# Patient Record
Sex: Male | Born: 1950 | Race: White | Hispanic: No | Marital: Married | Smoking: Never smoker
Health system: Southern US, Community
[De-identification: ages and names within clinical notes are randomized; demographics above are authoritative.]

## PROBLEM LIST (undated history)

## (undated) DIAGNOSIS — H269 Unspecified cataract: Secondary | ICD-10-CM

## (undated) DIAGNOSIS — I10 Essential (primary) hypertension: Secondary | ICD-10-CM

## (undated) DIAGNOSIS — E785 Hyperlipidemia, unspecified: Secondary | ICD-10-CM

## (undated) DIAGNOSIS — H539 Unspecified visual disturbance: Secondary | ICD-10-CM

## (undated) DIAGNOSIS — R55 Syncope and collapse: Secondary | ICD-10-CM

## (undated) DIAGNOSIS — M109 Gout, unspecified: Secondary | ICD-10-CM

## (undated) DIAGNOSIS — E039 Hypothyroidism, unspecified: Secondary | ICD-10-CM

## (undated) DIAGNOSIS — H43399 Other vitreous opacities, unspecified eye: Secondary | ICD-10-CM

## (undated) DIAGNOSIS — M25519 Pain in unspecified shoulder: Secondary | ICD-10-CM

## (undated) DIAGNOSIS — M1990 Unspecified osteoarthritis, unspecified site: Secondary | ICD-10-CM

## (undated) HISTORY — PX: VASECTOMY: SHX75

## (undated) HISTORY — DX: Syncope and collapse: R55

## (undated) HISTORY — PX: KNEE ARTHROSCOPY: SUR90

## (undated) HISTORY — DX: Hypothyroidism, unspecified: E03.9

## (undated) HISTORY — DX: Pain in unspecified shoulder: M25.519

## (undated) HISTORY — DX: Unspecified cataract: H26.9

## (undated) HISTORY — DX: Hyperlipidemia, unspecified: E78.5

## (undated) HISTORY — PX: EYE SURGERY: SHX253

## (undated) HISTORY — DX: Other vitreous opacities, unspecified eye: H43.399

## (undated) HISTORY — PX: CARPAL TUNNEL RELEASE: SHX101

## (undated) HISTORY — DX: Gout, unspecified: M10.9

## (undated) HISTORY — DX: Unspecified visual disturbance: H53.9

## (undated) HISTORY — DX: Unspecified osteoarthritis, unspecified site: M19.90

## (undated) HISTORY — DX: Essential (primary) hypertension: I10

---

## 2004-04-22 ENCOUNTER — Emergency Department (HOSPITAL_COMMUNITY): Admission: EM | Admit: 2004-04-22 | Discharge: 2004-04-22 | Payer: Self-pay | Admitting: Emergency Medicine

## 2006-04-08 ENCOUNTER — Ambulatory Visit: Payer: Self-pay | Admitting: Gastroenterology

## 2006-04-15 ENCOUNTER — Ambulatory Visit: Payer: Self-pay | Admitting: Gastroenterology

## 2007-05-04 LAB — HM COLONOSCOPY: HM Colonoscopy: NORMAL

## 2007-07-20 ENCOUNTER — Encounter: Payer: Self-pay | Admitting: Family Medicine

## 2008-08-12 ENCOUNTER — Emergency Department (HOSPITAL_COMMUNITY): Admission: EM | Admit: 2008-08-12 | Discharge: 2008-08-12 | Payer: Self-pay | Admitting: Emergency Medicine

## 2008-08-26 ENCOUNTER — Ambulatory Visit: Payer: Self-pay | Admitting: Family Medicine

## 2008-08-26 DIAGNOSIS — E785 Hyperlipidemia, unspecified: Secondary | ICD-10-CM | POA: Insufficient documentation

## 2008-08-26 DIAGNOSIS — E039 Hypothyroidism, unspecified: Secondary | ICD-10-CM | POA: Insufficient documentation

## 2008-08-26 DIAGNOSIS — I10 Essential (primary) hypertension: Secondary | ICD-10-CM | POA: Insufficient documentation

## 2008-08-26 HISTORY — DX: Hyperlipidemia, unspecified: E78.5

## 2008-08-26 HISTORY — DX: Essential (primary) hypertension: I10

## 2008-08-26 HISTORY — DX: Hypothyroidism, unspecified: E03.9

## 2008-11-26 ENCOUNTER — Telehealth: Payer: Self-pay | Admitting: Family Medicine

## 2008-11-29 ENCOUNTER — Ambulatory Visit: Payer: Self-pay | Admitting: Family Medicine

## 2008-12-02 LAB — CONVERTED CEMR LAB
ALT: 26 units/L (ref 0–53)
AST: 20 units/L (ref 0–37)
Albumin: 3.9 g/dL (ref 3.5–5.2)
Alkaline Phosphatase: 54 units/L (ref 39–117)
Bilirubin, Direct: 0.2 mg/dL (ref 0.0–0.3)
Cholesterol: 163 mg/dL (ref 0–200)
Direct LDL: 92.6 mg/dL
HDL: 39.5 mg/dL (ref >39.00)
Total Bilirubin: 1.3 mg/dL — ABNORMAL HIGH (ref 0.3–1.2)
Total CHOL/HDL Ratio: 4
Total Protein: 6.9 g/dL (ref 6.0–8.3)
Triglycerides: 233 mg/dL — ABNORMAL HIGH (ref 0.0–149.0)
VLDL: 46.6 mg/dL — ABNORMAL HIGH (ref 0.0–40.0)

## 2009-02-20 ENCOUNTER — Ambulatory Visit: Payer: Self-pay | Admitting: Family Medicine

## 2009-02-20 DIAGNOSIS — H43399 Other vitreous opacities, unspecified eye: Secondary | ICD-10-CM

## 2009-02-20 DIAGNOSIS — M109 Gout, unspecified: Secondary | ICD-10-CM | POA: Insufficient documentation

## 2009-02-20 HISTORY — DX: Gout, unspecified: M10.9

## 2009-02-20 HISTORY — DX: Other vitreous opacities, unspecified eye: H43.399

## 2009-06-12 ENCOUNTER — Telehealth: Payer: Self-pay | Admitting: Family Medicine

## 2009-06-13 ENCOUNTER — Ambulatory Visit: Payer: Self-pay | Admitting: Family Medicine

## 2009-06-19 ENCOUNTER — Telehealth: Payer: Self-pay | Admitting: Family Medicine

## 2009-07-11 ENCOUNTER — Telehealth: Payer: Self-pay | Admitting: Family Medicine

## 2009-09-11 ENCOUNTER — Ambulatory Visit: Payer: Self-pay | Admitting: Family Medicine

## 2009-09-11 DIAGNOSIS — M25519 Pain in unspecified shoulder: Secondary | ICD-10-CM

## 2009-09-11 HISTORY — DX: Pain in unspecified shoulder: M25.519

## 2009-09-12 LAB — CONVERTED CEMR LAB
TSH: 1.97 microintl units/mL (ref 0.35–5.50)
Uric Acid, Serum: 5.9 mg/dL (ref 4.0–7.8)

## 2009-10-10 ENCOUNTER — Telehealth: Payer: Self-pay | Admitting: Family Medicine

## 2009-10-14 ENCOUNTER — Ambulatory Visit: Payer: Self-pay | Admitting: Family Medicine

## 2009-10-14 DIAGNOSIS — H539 Unspecified visual disturbance: Secondary | ICD-10-CM

## 2009-10-14 HISTORY — DX: Unspecified visual disturbance: H53.9

## 2009-10-21 ENCOUNTER — Ambulatory Visit (HOSPITAL_COMMUNITY): Admission: RE | Admit: 2009-10-21 | Discharge: 2009-10-22 | Payer: Self-pay | Admitting: Ophthalmology

## 2010-03-21 ENCOUNTER — Emergency Department (HOSPITAL_COMMUNITY): Admission: EM | Admit: 2010-03-21 | Discharge: 2010-03-22 | Payer: Self-pay | Admitting: Emergency Medicine

## 2010-03-25 ENCOUNTER — Telehealth: Payer: Self-pay | Admitting: Family Medicine

## 2010-04-14 ENCOUNTER — Ambulatory Visit: Payer: Self-pay | Admitting: Family Medicine

## 2010-04-14 DIAGNOSIS — R55 Syncope and collapse: Secondary | ICD-10-CM | POA: Insufficient documentation

## 2010-04-14 HISTORY — DX: Syncope and collapse: R55

## 2010-04-16 LAB — CONVERTED CEMR LAB
ALT: 64 units/L — ABNORMAL HIGH (ref 0–53)
AST: 33 units/L (ref 0–37)
Albumin: 4.2 g/dL (ref 3.5–5.2)
Alkaline Phosphatase: 74 units/L (ref 39–117)
Bilirubin, Direct: 0.1 mg/dL (ref 0.0–0.3)
Cholesterol: 187 mg/dL (ref 0–200)
Direct LDL: 99.7 mg/dL
HDL: 35.6 mg/dL — ABNORMAL LOW (ref >39.00)
TSH: 2.99 microintl units/mL (ref 0.35–5.50)
Total Bilirubin: 0.5 mg/dL (ref 0.3–1.2)
Total CHOL/HDL Ratio: 5
Total Protein: 6.8 g/dL (ref 6.0–8.3)
Triglycerides: 456 mg/dL — ABNORMAL HIGH (ref 0.0–149.0)
Uric Acid, Serum: 5.6 mg/dL (ref 4.0–7.8)
VLDL: 91.2 mg/dL — ABNORMAL HIGH (ref 0.0–40.0)

## 2010-05-26 ENCOUNTER — Telehealth: Payer: Self-pay | Admitting: Family Medicine

## 2010-06-02 NOTE — Assessment & Plan Note (Signed)
Summary: EYE EVALUATION / VISUAL DISTURBANCE // RS   Vital Signs:  Patient profile:   60 year old male Temp:     98 degrees F oral BP sitting:   150 / 88  (left arm) Cuff size:   large  Vitals Entered By: Sid Falcon LPN (October 14, 2009 9:52 AM) CC: Visual disturbances  Vision Screening:Left eye with correction: 20 / 25 Right eye with correction: 20 / 30 Both eyes with correction: 20 / 30        Vision Entered By: Sid Falcon LPN (October 14, 2009 9:55 AM)   History of Present Illness: Onset Last Friday slightly cloudy vision R eye.  No eye pain. Symptoms are mild and not functionally limiting.  No injury and no chemical exposures. Denies eye drainage or redness.  No exacerbating or alleviating features. No contact use.  No hx of similar sxs.  Allergies: No Known Drug Allergies  Past History:  Past Medical History: Last updated: 06/13/2009 Arthritis knee Hyperlipidemia Hypertension Hypothyroidism Gout  Review of Systems      See HPI  Physical Exam  General:  Well-developed,well-nourished,in no acute distress; alert,appropriate and cooperative throughout examination Head:  Normocephalic and atraumatic without obvious abnormalities. No apparent alopecia or balding. Eyes:  Acuity-see recorded. pupils equal, pupils round, pupils reactive to light, pupils react to accomodation, corneas and lenses clear, no injection, no iris abnormalities, and no nystagmus.  R superior retinal appears abnormal without normal red reflex.  L retina normal. Ears:  External ear exam shows no significant lesions or deformities.  Otoscopic examination reveals clear canals, tympanic membranes are intact bilaterally without bulging, retraction, inflammation or discharge. Hearing is grossly normal bilaterally. Neck:  No deformities, masses, or tenderness noted. Lungs:  Normal respiratory effort, chest expands symmetrically. Lungs are clear to auscultation, no crackles or wheezes. Heart:   Normal rate and regular rhythm. S1 and S2 normal without gallop, murmur, click, rub or other extra sounds.   Impression & Recommendations:  Problem # 1:  UNSPECIFIED VISUAL DISTURBANCE (ICD-368.9) Assessment New pt will be seen by ophthalmology today to further assess.  Sent to [facility]. Orders: Ophthalmology Referral (Ophthalmology)  Complete Medication List: 1)  Lipitor 40 Mg Tabs (Atorvastatin calcium) .... Once daily 2)  Amlodipine Besylate 5 Mg Tabs (Amlodipine besylate) .... Once daily 3)  Levothroid 112 Mcg Tabs (Levothyroxine sodium) .... Once daily 4)  Prednisone 20 Mg Tabs (Prednisone) .... Taper as follows: 3-3-2-2-2-1-1 5)  Allopurinol 300 Mg Tabs (Allopurinol) .... Once daily

## 2010-06-02 NOTE — Assessment & Plan Note (Signed)
Summary: fu on meds/njr   Vital Signs:  Patient profile:   60 year old male BP sitting:   150 / 90  (left arm) Cuff size:   large  Vitals Entered By: Sid Falcon LPN (June 13, 2009 5:00 PM) CC: Follow-up on meds, gout flare-up recently   History of Present Illness: Patient here to discuss gout issues.  just saw orthopedist last week and had right knee effusion secondary to gout. Does not have many flareups per year but has fairly severe flareups. Acute treatments include prednisone and colchicine which seemed to work effectively.  drinks some alcohol but is conscious to avoid excessive use and also hydrates well. Has not noted correlation with any specific foods.   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 Lungs are clear to auscultation, no crackles or wheezes. Heart:  Normal rate and regular rhythm. S1 and S2 normal without gallop, murmur, click, rub or other extra sounds. Extremities:  No clubbing, cyanosis, edema, or deformity noted with normal full range of motion of all joints.     Impression & Recommendations:  Problem # 1:  GOUT, UNSPECIFIED (ICD-274.9) long discussion with patient regarding options. Would like to consider preventative therapy. Give current episode another couple weeks to settle down. Start allopurinol and titrate slowly as instructed.  Use low dose colchicine  during titration phase. Reassess 2 months and obtain uric acid level then His updated medication list for this problem includes:    Colchicine 0.6 Mg Tabs (Colchicine) ..... One by mouth two times a day as needed gout    Allopurinol 100 Mg Tabs (Allopurinol) ..... One by mouth once daily for 2 weeks then two by mouth once daily for 2 weeks then 3 by mouth once daily  Complete Medication List: 1)  Lipitor 40 Mg Tabs (Atorvastatin calcium) .... Once daily 2)  Amlodipine Besylate 5 Mg Tabs (Amlodipine besylate) .... Once daily 3)  Levothroid 112 Mcg Tabs (Levothyroxine sodium) .... Once daily 4)  Colchicine 0.6 Mg Tabs (Colchicine) .... One by mouth two times a day as needed gout 5)  Prednisone 20 Mg Tabs (Prednisone) .... Taper as follows: 3-3-2-2-2-1-1 6)  Allopurinol 100 Mg Tabs (Allopurinol) .... One by mouth once daily for 2 weeks then two by mouth once daily for 2 weeks then 3 by mouth once daily  Patient Instructions: 1)  Wait 1-2 weeks before starting allopurinol 2)  Titrate allopurinol as directed 3)  Take colchicine one to 2 tablets daily during initial titration 4)  Add low-dose prednisone as needed if any signs of acute flareup 5)  Please schedule a follow-up appointment in 2 months.  Prescriptions: ALLOPURINOL 100 MG TABS (ALLOPURINOL) one by mouth once daily for 2 weeks then two by mouth once daily for 2 weeks then 3 by mouth once daily  #90 x 5   Entered and Authorized by:   Evelena Peat MD   Signed by:   Evelena Peat MD on 06/13/2009   Method used:   [facility]  [place] (retail)       [address]       [place], [place]  [address]       Ph: [phone] or [phone]       Fax: [phone]   RxID:   [id number]

## 2010-06-02 NOTE — Assessment & Plan Note (Signed)
Summary: shoulder pain/njr   Vital Signs:  Patient profile:   60 year old male Temp:     98.6 degrees F oral BP sitting:   160 / 90  (left arm) Cuff size:   large  Vitals Entered By: Sid Falcon LPN (Sep 11, 2009 1:51 PM)  Serial Vital Signs/Assessments:  Time      Position  BP       Pulse  Resp  Temp     By                     142/90                         Evelena Peat MD  CC: Right shoulder discomfort, Hypertension Management   History of Present Illness: Patient here for the following items.  Acute problem of right shoulder pain. Onset 2 weeks ago. No specific injury. Plays a lot of golf. Pain is achy in quality moderate severity worse with abduction and internal rotation. No neck pain. No obvious weakness. Heat and topical cream without relief. Ice helped. Advil helps slightly.  History gout. Recently started allopurinol. 3 episodes of acute gout since starting allopurino. At 300 mg dose for 6 weeks now. He has some ongoing inflammatory changes right hand. Prednisone helps.  History hypertension. Compliant with amlodipine. No dizziness. Does not monitor blood pressure.  Hypertension History:      He denies headache, chest pain, palpitations, dyspnea with exertion, orthopnea, peripheral edema, syncope, and side effects from treatment.        Positive major cardiovascular risk factors include male age 48 years old or older, hyperlipidemia, and hypertension.  Negative major cardiovascular risk factors include non-tobacco-user status.     Allergies (verified): No Known Drug Allergies  Past History:  Past Medical History: Last updated: 06/13/2009 Arthritis knee Hyperlipidemia Hypertension Hypothyroidism Gout  Review of Systems      See HPI  Physical Exam  General:  Well-developed,well-nourished,in no acute distress; alert,appropriate and cooperative throughout examination Mouth:  Oral mucosa and oropharynx without lesions or exudates.  Teeth in good  repair. Neck:  No deformities, masses, or tenderness noted. Lungs:  Normal respiratory effort, chest expands symmetrically. Lungs are clear to auscultation, no crackles or wheezes. Heart:  Normal rate and regular rhythm. S1 and S2 normal without gallop, murmur, click, rub or other extra sounds. Extremities:  right hand reveals minimal increased warmth. Mild swelling involving the digits of the right hand compared to the left. No wrist pain. No rashes.   Shoulder/Elbow Exam  General:    Well-developed, well-nourished, normal body habitus; no deformities, normal grooming.    Inspection:    Inspection is normal.    Palpation:    Non-tender to palpation bilaterally.    Shoulder Exam:    Right:    Inspection:  Normal    Palpation:  Normal    Stability:  stable    Tenderness:  no    Swelling:  no    Erythema:  no    pain with abduction against resistance and internal rotation.   Impression & Recommendations:  Problem # 1:  GOUT, UNSPECIFIED (ICD-274.9) repeat uric acid.  Pred taper prescribed. The following medications were removed from the medication list:    Colchicine 0.6 Mg Tabs (Colchicine) ..... One by mouth two times a day as needed gout His updated medication list for this problem includes:    Allopurinol 100 Mg Tabs (Allopurinol) .Marland KitchenMarland KitchenMarland KitchenMarland Kitchen  One by mouth once daily for 2 weeks then two by mouth once daily for 2 weeks then 3 by mouth once daily  Orders: Venipuncture ([address]) TLB-Uric Acid, Blood (84550-URIC)  Problem # 2:  HYPERTENSION (ICD-401.9) marginal control here.  Work on weight loss and monitor. His updated medication list for this problem includes:    Amlodipine Besylate 5 Mg Tabs (Amlodipine besylate) ..... Once daily  Problem # 3:  SHOULDER PAIN (ICD-719.41) Assessment: New Suspect rotator cuff tendonitis.  Improving.  pred taper and cont icing and gentle ROM stretches.  Problem # 4:  HYPOTHYROIDISM (ICD-244.9)  His updated medication list for this problem  includes:    Levothroid 112 Mcg Tabs (Levothyroxine sodium) ..... Once daily  Orders: Venipuncture ([address]) TLB-TSH (Thyroid Stimulating Hormone) (84443-TSH)  Complete Medication List: 1)  Lipitor 40 Mg Tabs (Atorvastatin calcium) .... Once daily 2)  Amlodipine Besylate 5 Mg Tabs (Amlodipine besylate) .... Once daily 3)  Levothroid 112 Mcg Tabs (Levothyroxine sodium) .... Once daily 4)  Prednisone 20 Mg Tabs (Prednisone) .... Taper as follows: 3-3-2-2-2-1-1 5)  Allopurinol 100 Mg Tabs (Allopurinol) .... One by mouth once daily for 2 weeks then two by mouth once daily for 2 weeks then 3 by mouth once daily  Hypertension Assessment/Plan:      The patient's hypertensive risk group is category B: At least one risk factor (excluding diabetes) with no target organ damage.  Today's blood pressure is 160/90.    Patient Instructions: 1)  It is important that you exercise reguarly at least 20 minutes 5 times a week. If you develop chest pain, have severe difficulty breathing, or feel very tired, stop exercising immediately and seek medical attention.  2)  You need to lose weight. Consider a lower calorie diet and regular exercise.  3)  Check your  Blood Pressure regularly . If it is above:  140/90 you should make an appointment. Prescriptions: PREDNISONE 20 MG TABS (PREDNISONE) taper as follows: 3-3-2-2-2-1-1  #14 x 1   Entered and Authorized by:   Evelena Peat MD   Signed by:   Evelena Peat MD on 09/11/2009   Method used:   [facility]        [facility]  [place] (retail)       [address]       [place], [place]  [address]       Ph: [phone] or [phone]       Fax: [phone]   RxID:   [id number]

## 2010-06-02 NOTE — Progress Notes (Signed)
Summary: need gout meds  Phone Note Call from Patient Call back at Work Phone [phone]   Caller: Patient----LIVE CALL Summary of Call: Barry Vazquez to Ortho Specialist, Dr Darrelyn Hillock, on Monday for gout. They told him that he will need somrthing for  this . Ortho dr would not prescribe the meds. [facility] [place]. Any ? call the patient. Initial call taken by: Warnell Forester,  June 12, 2009 10:16 AM  Follow-up for Phone Call        There are acute and chronic (suppressive) treatments for gout.  If he is referring to acute flare, he has taken prednisone in the past and we could refill once.  If ortho suggested daily suppressive therapy he would need f/u to discuss as there are several important issues to explain in how we would titrate, etc. Follow-up by: Evelena Peat MD,  June 12, 2009 10:49 AM  Additional Follow-up for Phone Call Additional follow up Details #1::        Pt states he needs suppressive therapy, and will cal back with his schedule to make appt with Dr. Caryl Never. Additional Follow-up by: Lynann Beaver CMA,  June 12, 2009 12:50 PM

## 2010-06-02 NOTE — Progress Notes (Signed)
Summary: Pt req new script for Allopurinol 300mg  qd  Phone Note Call from Patient Call back at Home Phone [phone]   Caller: Patient Summary of Call: Pt called and said that he needs new script for Allopurinol 300mg  once daily. [facility] in to [facility] [place] Initial call taken by: Lucy Antigua,  October 10, 2009 3:41 PM    New/Updated Medications: ALLOPURINOL 300 MG TABS (ALLOPURINOL) once daily Prescriptions: ALLOPURINOL 300 MG TABS (ALLOPURINOL) once daily  #90 x 3   Entered by:   Sid Falcon LPN   Authorized by:   Evelena Peat MD   Signed by:   Sid Falcon LPN on 09/81/1914   Method used:   Electronically to        [facility]  [place] (retail)       [address]       [place], [place]  [address]       Ph: [phone] or [phone]       Fax: [phone]   RxID:   [id number]

## 2010-06-02 NOTE — Progress Notes (Signed)
Summary: "Blacked out at a party" FYI  Phone Note Call from Patient   Caller: Patient Call For: Evelena Peat MD Summary of Call: Med refill request Also, pt requested Dr Caryl Never be informed he "blacked out at a party Sat night".  To [facility] ER, tons of testing, labs, CT scans all neg.  I feel great".  Syncope episode?  Offered F/U OV.  Pt wants to drop off his paperwork and "if Dr Caryl Never wants to see me, let him know". Initial call taken by: Sid Falcon LPN,  March 25, 2010 10:43 AM  Follow-up for Phone Call        I would rec follow up to reassess. Follow-up by: Evelena Peat MD,  March 25, 2010 12:42 PM  Additional Follow-up for Phone Call Additional follow up Details #1::        Message left on home phone to call for OV Additional Follow-up by: Sid Falcon LPN,  March 25, 2010 1:19 PM    Prescriptions: AMLODIPINE BESYLATE 5 MG TABS (AMLODIPINE BESYLATE) once daily  #90 x 3   Entered by:   Sid Falcon LPN   Authorized by:   Evelena Peat MD   Signed by:   Sid Falcon LPN on 91/47/8295   Method used:   Electronically to        [facility]* (retail)             ,          Ph: [phone]       Fax: [phone]   RxID:   [id number]

## 2010-06-02 NOTE — Progress Notes (Signed)
Summary: Rx request for gout  Phone Note Call from Patient   Caller: Patient Call For: Evelena Peat MD Summary of Call: called from airport, will return Sunday. Is having gout trouble- requesting prednisone  to pick up [facility] Initial call taken by: Raechel Ache, RN,  June 19, 2009 3:48 PM  Follow-up for Phone Call        OK to call in prednisone 10mg  taper: 4-4-4-3-3-2-1, disp#21 Follow-up by: Evelena Peat MD,  June 19, 2009 4:30 PM  Additional Follow-up for Phone Call Additional follow up Details #1::        Rx Called In Additional Follow-up by: Raechel Ache, RN,  June 19, 2009 4:35 PM

## 2010-06-02 NOTE — Progress Notes (Signed)
Summary: refill  Phone Note Call from Patient   Caller: Patient Call For: Evelena Peat MD Summary of Call: took last of prednisone and still has some gout flare in finger- asking for another refill. [facility]  his cell [phone] Initial call taken by: Raechel Ache, RN,  July 11, 2009 11:46 AM  Follow-up for Phone Call        may refill once. Follow-up by: Evelena Peat MD,  July 11, 2009 11:56 AM    Prescriptions: PREDNISONE 20 MG TABS (PREDNISONE) taper as follows: 3-3-2-2-2-1-1  #14 x 1   Entered by:   Raechel Ache, RN   Authorized by:   Evelena Peat MD   Signed by:   Raechel Ache, RN on 07/11/2009   Method used:   [facility]        [facility]  [place] (retail)       [address]       [place], [place]  [address]       Ph: [phone] or [phone]       Fax: [phone]   RxID:   [id number]

## 2010-06-04 NOTE — Progress Notes (Signed)
Summary: REFILL REQUEST prednisone  Phone Note Refill Request Message from:  Patient on May 26, 2010 12:15 PM  Refills Requested: Medication #1:  PREDNISONE 20 MG TABS taper as follows: 3-3-2-2-2-1-1   Notes: Gout flare up in R knee.... [facility] [place] Pt can be reached at  [phone] with any questions / concerns.    Initial call taken by: Debbra Riding,  May 26, 2010 12:16 PM  Follow-up for Phone Call        OK to refill once Follow-up by: Evelena Peat MD,  May 26, 2010 2:45 PM    Prescriptions: PREDNISONE 20 MG TABS (PREDNISONE) taper as follows: 3-3-2-2-2-1-1  #14 Tablet x 0   Entered by:   Sid Falcon LPN   Authorized by:   Evelena Peat MD   Signed by:   Sid Falcon LPN on 60/73/7106   Method used:   Electronically to        [facility]  [place] (retail)       [address]       [place], [place]  [address]       Ph: [phone] or [phone]       Fax: [phone]   RxID:   [id number]

## 2010-06-04 NOTE — Assessment & Plan Note (Signed)
Summary: FUP ON MEDS//CCM/pt rsc/cjr   Vital Signs:  Patient profile:   60 year old male Weight:      265 pounds Temp:     98.3 degrees F oral BP sitting:   142 / 82  (left arm) Cuff size:   large  Vitals Entered By: Sid Falcon LPN (April 14, 2010 11:08 AM)  History of Present Illness: Patient seen for followup multiple medical problems. History of gout which has been treated with allopurinol no recent flareups. History hypothyroidism and takes Levothroid. Compliant with therapy. No fatigue issues. Hypertension treated with amlodipine. Hyperlipidemia treated with Lipitor.  Detached retina his last visit here and had surgery November. 2 days after his surgery he had syncopal episode at a party. He had been given some sort of diuretic following his surgery. He recalls feeling nausea and sweaty followed by very brief syncope.  Blood pressure was low very transiently. Patient was seen in emergency room and had several labs which were unremarkable. EKG unremarkable. Cardiac enzymes negative. CT of the head unremarkable. No dizziness since then. Possible vagal reaction.  ER records reviewed.  ?vagal episode.  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      Physical Exam  General:  Well-developed,well-nourished,in no acute distress; alert,appropriate and cooperative throughout examination Ears:  External ear exam shows no significant lesions or deformities.  Otoscopic examination reveals clear canals, tympanic membranes are intact bilaterally without bulging, retraction, inflammation or discharge. Hearing is grossly normal bilaterally. Mouth:  Oral mucosa and oropharynx without lesions or exudates.  Teeth in good repair. Neck:  No deformities, masses, or tenderness noted. Lungs:  Normal respiratory effort, chest expands symmetrically. Lungs are clear to auscultation, no crackles or wheezes. Heart:  Normal rate and regular rhythm. S1 and S2 normal without gallop, murmur, click, rub or other extra sounds. Extremities:  No clubbing, cyanosis, edema, or deformity noted with normal full range of motion of all joints.   Neurologic:  alert & oriented X3, cranial nerves II-XII intact, strength normal in all extremities, sensation intact to light touch, and gait normal.     Impression & Recommendations:  Problem # 1:  HYPERTENSION (ICD-401.9)  His updated medication list for this problem includes:    Amlodipine Besylate 5 Mg Tabs (Amlodipine besylate) ..... Once daily  Problem # 2:  HYPOTHYROIDISM (ICD-244.9)  His updated medication list for this problem includes:    Levothroid 112 Mcg Tabs (Levothyroxine sodium) ..... Once daily  Orders: Specimen Handling ([address]) Venipuncture (981[address]) TLB-TSH (Thyroid Stimulating Hormone) (84443-TSH)  Problem # 3:  GOUT, UNSPECIFIED (ICD-274.9) Assessment: Unchanged  His updated medication list for this problem includes:    Allopurinol 300 Mg Tabs (Allopurinol) ..... Once daily  Orders: Specimen Handling ([address]) Venipuncture ([address]) TLB-Uric Acid, Blood (84550-URIC)  Problem # 4:  HYPERLIPIDEMIA (ICD-272.4)  His updated medication list for this problem includes:    Lipitor 40 Mg Tabs (Atorvastatin  calcium) ..... Once daily  Orders:  Specimen Handling ([address]) Venipuncture ([address]) TLB-Hepatic/Liver Function Pnl (80076-HEPATIC) TLB-Lipid Panel (80061-LIPID)  Problem # 5:  SYNCOPE (ICD-780.2) ?vagal episode.  None since then.  Complete Medication List: 1)  Lipitor 40 Mg Tabs (Atorvastatin calcium) .... Once daily 2)  Amlodipine Besylate 5 Mg Tabs (Amlodipine besylate) .... Once daily 3)  Levothroid 112 Mcg Tabs (Levothyroxine sodium) .... Once daily 4)  Prednisone 20 Mg Tabs (Prednisone) .... Taper as follows: 3-3-2-2-2-1-1 5)  Allopurinol 300 Mg Tabs (Allopurinol) .... Once daily  Patient Instructions: 1)  Consider complete physical at some point next year.   Orders Added: 1)  Specimen Handling [[address]] 2)  Venipuncture [[address]] 3)  TLB-Hepatic/Liver Function Pnl [80076-HEPATIC] 4)  TLB-Lipid Panel [80061-LIPID] 5)  TLB-Uric Acid, Blood [84550-URIC] 6)  TLB-TSH (Thyroid Stimulating Hormone) [84443-TSH] 7)  Est. Patient Level IV [91478]

## 2010-06-28 ENCOUNTER — Other Ambulatory Visit: Payer: Self-pay | Admitting: Family Medicine

## 2010-06-28 DIAGNOSIS — E039 Hypothyroidism, unspecified: Secondary | ICD-10-CM

## 2010-06-28 DIAGNOSIS — E785 Hyperlipidemia, unspecified: Secondary | ICD-10-CM

## 2010-07-14 LAB — BASIC METABOLIC PANEL
BUN: 18 mg/dL (ref 6–23)
CO2: 24 mEq/L (ref 19–32)
Calcium: 8.8 mg/dL (ref 8.4–10.5)
Chloride: 106 mEq/L (ref 96–112)
Creatinine, Ser: 1.25 mg/dL (ref 0.4–1.5)
GFR calc Af Amer: >60 mL/min (ref >60)
GFR calc non Af Amer: 60 mL/min — ABNORMAL LOW (ref >60)
Glucose, Bld: 159 mg/dL — ABNORMAL HIGH (ref 70–99)
Potassium: 3.7 mEq/L (ref 3.5–5.1)
Sodium: 138 mEq/L (ref 135–145)

## 2010-07-14 LAB — CBC
HCT: 43.8 % (ref 39.0–52.0)
Hemoglobin: 15.3 g/dL (ref 13.0–17.0)
MCH: 29.8 pg (ref 26.0–34.0)
MCHC: 34.9 g/dL (ref 30.0–36.0)
MCV: 85.4 fL (ref 78.0–100.0)
Platelets: 246 10*3/uL (ref 150–400)
RBC: 5.13 MIL/uL (ref 4.22–5.81)
RDW: 13.4 % (ref 11.5–15.5)
WBC: 9.9 10*3/uL (ref 4.0–10.5)

## 2010-07-14 LAB — DIFFERENTIAL
Basophils Absolute: 0.1 10*3/uL (ref 0.0–0.1)
Basophils Relative: 1 % (ref 0–1)
Eosinophils Absolute: 0.3 10*3/uL (ref 0.0–0.7)
Eosinophils Relative: 3 % (ref 0–5)
Lymphocytes Relative: 29 % (ref 12–46)
Lymphs Abs: 2.9 10*3/uL (ref 0.7–4.0)
Monocytes Absolute: 0.9 10*3/uL (ref 0.1–1.0)
Monocytes Relative: 9 % (ref 3–12)
Neutro Abs: 5.8 10*3/uL (ref 1.7–7.7)
Neutrophils Relative %: 59 % (ref 43–77)

## 2010-07-14 LAB — POCT CARDIAC MARKERS
CKMB, poc: <1 ng/mL — ABNORMAL LOW (ref 1.0–8.0)
Myoglobin, poc: 80.8 ng/mL (ref 12–200)
Troponin i, poc: <0.05 ng/mL (ref 0.00–0.09)

## 2010-07-14 LAB — ETHANOL: Alcohol, Ethyl (B): <5 mg/dL (ref 0–10)

## 2010-07-19 LAB — BASIC METABOLIC PANEL
BUN: 15 mg/dL (ref 6–23)
CO2: 29 mEq/L (ref 19–32)
Calcium: 9.5 mg/dL (ref 8.4–10.5)
Chloride: 103 mEq/L (ref 96–112)
Creatinine, Ser: 0.91 mg/dL (ref 0.4–1.5)
GFR calc Af Amer: >60 mL/min (ref >60)
GFR calc non Af Amer: >60 mL/min (ref >60)
Glucose, Bld: 113 mg/dL — ABNORMAL HIGH (ref 70–99)
Potassium: 4.2 mEq/L (ref 3.5–5.1)
Sodium: 138 mEq/L (ref 135–145)

## 2010-07-19 LAB — CBC
HCT: 45 % (ref 39.0–52.0)
Hemoglobin: 15.6 g/dL (ref 13.0–17.0)
MCHC: 34.8 g/dL (ref 30.0–36.0)
MCV: 89.5 fL (ref 78.0–100.0)
Platelets: 249 10*3/uL (ref 150–400)
RBC: 5.03 MIL/uL (ref 4.22–5.81)
RDW: 14 % (ref 11.5–15.5)
WBC: 9.3 10*3/uL (ref 4.0–10.5)

## 2010-09-21 ENCOUNTER — Other Ambulatory Visit: Payer: Self-pay | Admitting: Family Medicine

## 2010-09-23 ENCOUNTER — Other Ambulatory Visit: Payer: Self-pay | Admitting: *Deleted

## 2010-09-23 MED ORDER — ALLOPURINOL 300 MG PO TABS: 300.0000 mg | ORAL_TABLET | Freq: Every day | ORAL | Status: DC

## 2010-12-29 ENCOUNTER — Other Ambulatory Visit: Payer: Self-pay | Admitting: Family Medicine

## 2010-12-30 ENCOUNTER — Telehealth: Payer: Self-pay | Admitting: Family Medicine

## 2010-12-31 ENCOUNTER — Ambulatory Visit (INDEPENDENT_AMBULATORY_CARE_PROVIDER_SITE_OTHER): Payer: 59 | Admitting: Family Medicine

## 2010-12-31 ENCOUNTER — Encounter: Payer: Self-pay | Admitting: Family Medicine

## 2010-12-31 DIAGNOSIS — E039 Hypothyroidism, unspecified: Secondary | ICD-10-CM

## 2010-12-31 DIAGNOSIS — E785 Hyperlipidemia, unspecified: Secondary | ICD-10-CM

## 2010-12-31 DIAGNOSIS — Z0000 Encounter for general adult medical examination without abnormal findings: Secondary | ICD-10-CM

## 2010-12-31 DIAGNOSIS — I10 Essential (primary) hypertension: Secondary | ICD-10-CM

## 2010-12-31 DIAGNOSIS — M109 Gout, unspecified: Secondary | ICD-10-CM

## 2010-12-31 LAB — BASIC METABOLIC PANEL
BUN: 15 mg/dL (ref 6–23)
Calcium: 9.3 mg/dL (ref 8.4–10.5)
Chloride: 100 mEq/L (ref 96–112)
Creatinine, Ser: 0.9 mg/dL (ref 0.4–1.5)
GFR: 95.66 mL/min (ref >60.00)
Glucose, Bld: 87 mg/dL (ref 70–99)
Potassium: 4.1 mEq/L (ref 3.5–5.1)
Sodium: 138 mEq/L (ref 135–145)

## 2010-12-31 LAB — HEPATIC FUNCTION PANEL
ALT: 34 U/L (ref 0–53)
AST: 20 U/L (ref 0–37)
Albumin: 4.6 g/dL (ref 3.5–5.2)
Alkaline Phosphatase: 74 U/L (ref 39–117)
Bilirubin, Direct: 0 mg/dL (ref 0.0–0.3)
Total Bilirubin: 1.1 mg/dL (ref 0.3–1.2)

## 2010-12-31 LAB — LIPID PANEL
HDL: 41 mg/dL (ref >39.00)
Total CHOL/HDL Ratio: 4
VLDL: 60.4 mg/dL — ABNORMAL HIGH (ref 0.0–40.0)

## 2010-12-31 LAB — TSH: TSH: 1.46 u[IU]/mL (ref 0.35–5.50)

## 2010-12-31 MED ORDER — ATORVASTATIN CALCIUM 40 MG PO TABS: 40.0000 mg | ORAL_TABLET | Freq: Every day | ORAL | Status: DC

## 2010-12-31 MED ORDER — AMLODIPINE BESY-BENAZEPRIL HCL 5-10 MG PO CAPS: 1.0000 | ORAL_CAPSULE | Freq: Every day | ORAL | Status: DC

## 2010-12-31 MED ORDER — LEVOTHYROXINE SODIUM 112 MCG PO TABS: 112.0000 ug | ORAL_TABLET | Freq: Every day | ORAL | Status: DC

## 2010-12-31 MED ORDER — ALLOPURINOL 300 MG PO TABS: 300.0000 mg | ORAL_TABLET | Freq: Every day | ORAL | Status: DC

## 2010-12-31 NOTE — Progress Notes (Signed)
  Subjective:    Patient ID: Barry Vazquez, male    DOB: 1950/06/09, 60 y.o.   MRN: [id number]  HPI Patient seen for followup. Has history of hypothyroidism, hyperlipidemia, gout, and hypertension. Not monitoring blood pressures. Currently takes amlodipine 5 mg daily for hypertension. No dizziness or headaches. Denies chest pain. No consistent exercise. Takes Lipitor for hyperlipidemia. No myalgias. No history of CAD or peripheral vascular disease.  History of hypothyroidism. Takes levothyroxine 112 mcg daily. Needs followup lab work. No recent gout flareups. Great improvement since starting allopurinol.  Past Medical History  Diagnosis Date  . HYPOTHYROIDISM 08/26/2008  . HYPERLIPIDEMIA 08/26/2008  . Gout, unspecified 02/20/2009  . Unspecified visual disturbance 10/14/2009  . EYE FLOATERS 02/20/2009  . HYPERTENSION 08/26/2008  . SHOULDER PAIN 09/11/2009  . SYNCOPE 04/14/2010   Past Surgical History  Procedure Date  . Knee arthroscopy     left X 2  . Carpal tunnel release     left wrist    reports that he has never smoked. He does not have any smokeless tobacco history on file. His alcohol and drug histories not on file. family history includes Depression in his other. Not on File    Review of Systems  Constitutional: Negative for fever, activity change, appetite change and fatigue.  HENT: Negative for ear pain, congestion and trouble swallowing.   Eyes: Negative for pain and visual disturbance.  Respiratory: Negative for cough, shortness of breath and wheezing.   Cardiovascular: Negative for chest pain and palpitations.  Gastrointestinal: Negative for nausea, vomiting, abdominal pain, diarrhea, constipation, blood in stool, abdominal distention and rectal pain.  Genitourinary: Negative for dysuria, hematuria and testicular pain.  Musculoskeletal: Negative for joint swelling and arthralgias.  Skin: Negative for rash.  Neurological: Negative for dizziness, syncope and headaches.   Hematological: Negative for adenopathy.  Psychiatric/Behavioral: Negative for confusion and dysphoric mood.       Objective:   Physical Exam  Constitutional: He is oriented to person, place, and time. He appears well-developed and well-nourished.  HENT:  Mouth/Throat: Oropharynx is clear and moist.  Neck: Neck supple. No thyromegaly present.  Cardiovascular: Normal rate, regular rhythm and normal heart sounds.   No murmur heard. Pulmonary/Chest: Effort normal and breath sounds normal. No respiratory distress. He has no wheezes. He has no rales.  Musculoskeletal: He exhibits no edema.  Lymphadenopathy:    He has no cervical adenopathy.  Neurological: He is alert and oriented to person, place, and time.  Psychiatric: He has a normal mood and affect.          Assessment & Plan:  #1 hypertension suboptimal control. Change amlodipine to Lotrel 5/10 one daily.  Followup one month for blood pressure reassessment #2 hyperlipidemia. Check lipids and hepatic #3 gout. Stable. Refill allopurinol  #4 hypothyroid. Recheck TSH. Refilled levothyroxine

## 2011-01-01 NOTE — Progress Notes (Signed)
Quick Note:  Pt informed ______ 

## 2011-02-05 NOTE — Telephone Encounter (Signed)
Chart opened in error

## 2011-03-02 ENCOUNTER — Encounter: Payer: Self-pay | Admitting: Family Medicine

## 2011-03-02 ENCOUNTER — Ambulatory Visit (INDEPENDENT_AMBULATORY_CARE_PROVIDER_SITE_OTHER): Payer: Managed Care, Other (non HMO) | Admitting: Family Medicine

## 2011-03-02 VITALS — BP 140/86 | Temp 98.5°F | Wt 254.0 lb

## 2011-03-02 DIAGNOSIS — R55 Syncope and collapse: Secondary | ICD-10-CM

## 2011-03-02 LAB — GLUCOSE, POCT (MANUAL RESULT ENTRY): POC Glucose: 88

## 2011-03-02 NOTE — Patient Instructions (Signed)
Follow up immediately for any recurrent syncopal episodes.

## 2011-03-02 NOTE — Progress Notes (Signed)
  Subjective:    Patient ID: Barry Vazquez, male    DOB: Mar 24, 1951, 60 y.o.   MRN: [id number]  HPI  Acute visit. Syncopal episode this past Friday night. Very similar episode almost exactly one year ago. Patient was attending a party and recalls clearing his voice several times and then suddenly felt warm and sweaty with brief loss of consciousness estimated around 1 minute. Denied any nausea or vomiting. He was very alert and knew where he was after coming around. No suspicion for any seizure activity. No urine or stool incontinence. Denied any chest pain or palpitations. No dyspnea. No dizziness since then. Reportedly had blood sugar around 60 by EMS. No history of diabetes. Does not take any hypoglycemic medications. Recent adjustment of blood pressure medication and blood pressures been stable. No recent orthostatic symptoms.  Patient had very similar episode one year ago while attending a party. Presumed vasovagal. Went to emergency room then with negative lab work and unremarkable CT of head. Chronic problems include history of hypothyroidism, hyperlipidemia, hypertension, and gout.  Past Medical History  Diagnosis Date  . HYPOTHYROIDISM 08/26/2008  . HYPERLIPIDEMIA 08/26/2008  . Gout, unspecified 02/20/2009  . Unspecified visual disturbance 10/14/2009  . EYE FLOATERS 02/20/2009  . HYPERTENSION 08/26/2008  . SHOULDER PAIN 09/11/2009  . SYNCOPE 04/14/2010   Past Surgical History  Procedure Date  . Knee arthroscopy     left X 2  . Carpal tunnel release     left wrist    reports that he has never smoked. He does not have any smokeless tobacco history on file. His alcohol and drug histories not on file. family history includes Depression in his other. No Known Allergies   Review of Systems  Constitutional: Negative for fever, chills, appetite change and fatigue.  Respiratory: Negative for cough and shortness of breath.   Cardiovascular: Negative for chest pain, palpitations and leg  swelling.  Gastrointestinal: Negative for abdominal pain and blood in stool.  Genitourinary: Negative for dysuria.  Neurological: Positive for syncope. Negative for tremors, seizures, weakness and headaches.       Objective:   Physical Exam  Constitutional: He is oriented to person, place, and time. He appears well-developed and well-nourished.  HENT:  Mouth/Throat: Oropharynx is clear and moist.  Neck: Neck supple. No thyromegaly present.       No carotid bruits  Cardiovascular: Normal rate and regular rhythm.   No murmur heard. Pulmonary/Chest: Effort normal and breath sounds normal. No respiratory distress. He has no wheezes. He has no rales.  Musculoskeletal: He exhibits no edema.  Lymphadenopathy:    He has no cervical adenopathy.  Neurological: He is alert and oriented to person, place, and time. No cranial nerve deficit.  Psychiatric: He has a normal mood and affect. His behavior is normal.          Assessment & Plan:  Syncopal episode. This makes 2 episodes in one year. Suspect this is still more likely vasovagal.   ? related to clearing throat-triggering vagal reaction.  No clinical suspicion for seizure activity. No orthostasis. Given repeat episode, cardiology referral to EP specialist. Obtain repeat EKG today-NSR with no acute abnormality.

## 2011-03-24 ENCOUNTER — Ambulatory Visit (INDEPENDENT_AMBULATORY_CARE_PROVIDER_SITE_OTHER): Payer: Managed Care, Other (non HMO) | Admitting: Internal Medicine

## 2011-03-24 ENCOUNTER — Encounter: Payer: Self-pay | Admitting: Internal Medicine

## 2011-03-24 DIAGNOSIS — I10 Essential (primary) hypertension: Secondary | ICD-10-CM

## 2011-03-24 DIAGNOSIS — R55 Syncope and collapse: Secondary | ICD-10-CM

## 2011-03-24 NOTE — Assessment & Plan Note (Signed)
His elevated blood pressure makes his treatment a bit more difficult. For now I will not change his medical therapy. If his symptoms worsen, we might consider allowing his blood pressure to be a bit more elevated.

## 2011-03-24 NOTE — Assessment & Plan Note (Signed)
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 I have discussed the pathophysiology of his condition and recommended that he increase his salt and fluid intake. In addition, I have asked that when the patient feels like his spell is coming on, that he lie down quickly. If initial conservative measures are unsuccessful at controlling his symptoms, we would consider additional medical therapy with [facility].

## 2011-03-24 NOTE — Patient Instructions (Signed)
Your physician recommends that you schedule a follow-up appointment as needed  

## 2011-03-24 NOTE — Progress Notes (Signed)
HPI Mr. Hollibaugh is referred today by Dr. Caryl Never for evaluation of syncope. Patient has enjoyed good health. He does have a history of arthritis and cataracts. He has hypertension and dyslipidemia. His first episode of syncope occurred one year ago. He notes that he got hot and suddenly passed out while standing. He was only out for a few seconds. When he awoke he was diaphoretic and taken to the emergency room where a thorough examination was unrevealing. He was separately discharged home. The patient was well until one month ago. This time he was at a bar and began to feel hot and clammy. He was having trouble with cough and hoarseness. When he awoke he again felt clammy and sweaty. Interestingly, he notes that he did not fall out of his chair. The patient has never lost bowel control or bladder continence. His syncopal episodes are not associated with chest pain or shortness of breath. He remains active and has neither of these symptoms with exertion. He denies peripheral edema as well. No Known Allergies   Current Outpatient Prescriptions  Medication Sig Dispense Refill  . allopurinol (ZYLOPRIM) 300 MG tablet Take 1 tablet (300 mg total) by mouth daily.  90 tablet  3  . amLODipine-benazepril (LOTREL) 5-10 MG per capsule Take 1 capsule by mouth daily.  90 capsule  3  . atorvastatin (LIPITOR) 40 MG tablet Take 1 tablet (40 mg total) by mouth daily.  90 tablet  3  . levothyroxine (SYNTHROID, LEVOTHROID) 112 MCG tablet Take 1 tablet (112 mcg total) by mouth daily.  90 tablet  3     Past Medical History  Diagnosis Date  . HYPOTHYROIDISM 08/26/2008  . HYPERLIPIDEMIA 08/26/2008  . Gout, unspecified 02/20/2009  . Unspecified visual disturbance 10/14/2009  . EYE FLOATERS 02/20/2009  . HYPERTENSION 08/26/2008  . SHOULDER PAIN 09/11/2009  . SYNCOPE 04/14/2010    ROS:   All systems reviewed and negative except as noted in the HPI.   Past Surgical History  Procedure Date  . Knee arthroscopy    left X 2  . Carpal tunnel release     left wrist     Family History  Problem Relation Age of Onset  . Depression Other     grandparent     History   Social History  . Marital Status: Married    Spouse Name: N/A    Number of Children: N/A  . Years of Education: N/A   Occupational History  . Not on file.   Social History Main Topics  . Smoking status: Never Smoker   . Smokeless tobacco: Not on file  . Alcohol Use: Yes     Occasionally  . Drug Use: Not on file  . Sexually Active: Not on file   Other Topics Concern  . Not on file   Social History Narrative  . No narrative on file     BP 143/90  Pulse 83  Ht 6\' 1"  (1.854 m)  Wt 115.722 kg (255 lb 1.9 oz)  BMI 33.66 kg/m2  Physical Exam:  Well appearing NAD HEENT: Unremarkable Neck:  No JVD, no thyromegally Lymphatics:  No adenopathy Back:  No CVA tenderness Lungs:  Clear no wheezes, rales, or rhonchi. HEART:  Regular rate rhythm, no murmurs, no rubs, no clicks Abd:  soft, positive bowel sounds, no organomegally, no rebound, no guarding Ext:  2 plus pulses, no edema, no cyanosis, no clubbing Skin:  No rashes no nodules Neuro:  CN II through XII intact, motor grossly  intact  EKG normal sinus rhythm with rare PACs  Assess/Plan:

## 2011-04-12 ENCOUNTER — Other Ambulatory Visit: Payer: Self-pay | Admitting: Family Medicine

## 2011-04-12 DIAGNOSIS — M109 Gout, unspecified: Secondary | ICD-10-CM

## 2011-04-12 DIAGNOSIS — E039 Hypothyroidism, unspecified: Secondary | ICD-10-CM

## 2011-04-12 DIAGNOSIS — E785 Hyperlipidemia, unspecified: Secondary | ICD-10-CM

## 2011-04-12 DIAGNOSIS — I10 Essential (primary) hypertension: Secondary | ICD-10-CM

## 2011-04-12 NOTE — Telephone Encounter (Signed)
Pt need new rx generic lipitor 40mg  , lotrel 5-10mg , levothyroxine 112mg  and allupurinol 300 mg call into [facility] [phone]

## 2011-04-13 ENCOUNTER — Telehealth: Payer: Self-pay

## 2011-04-13 MED ORDER — AMLODIPINE BESY-BENAZEPRIL HCL 5-10 MG PO CAPS: 1.0000 | ORAL_CAPSULE | Freq: Every day | ORAL | Status: DC

## 2011-04-13 MED ORDER — ALLOPURINOL 300 MG PO TABS: 300.0000 mg | ORAL_TABLET | Freq: Every day | ORAL | Status: DC

## 2011-04-13 MED ORDER — LEVOTHYROXINE SODIUM 112 MCG PO TABS: 112.0000 ug | ORAL_TABLET | Freq: Every day | ORAL | Status: DC

## 2011-04-13 MED ORDER — ATORVASTATIN CALCIUM 40 MG PO TABS: 40.0000 mg | ORAL_TABLET | Freq: Every day | ORAL | Status: DC

## 2011-04-13 NOTE — Telephone Encounter (Signed)
Open in error

## 2011-08-19 ENCOUNTER — Ambulatory Visit (INDEPENDENT_AMBULATORY_CARE_PROVIDER_SITE_OTHER): Payer: Managed Care, Other (non HMO) | Admitting: Family Medicine

## 2011-08-19 ENCOUNTER — Encounter: Payer: Self-pay | Admitting: Family Medicine

## 2011-08-19 VITALS — BP 130/74 | Temp 98.2°F | Wt 257.0 lb

## 2011-08-19 DIAGNOSIS — L0211 Cutaneous abscess of neck: Secondary | ICD-10-CM

## 2011-08-19 DIAGNOSIS — IMO0002 Reserved for concepts with insufficient information to code with codable children: Secondary | ICD-10-CM

## 2011-08-19 MED ORDER — CEPHALEXIN 500 MG PO CAPS: 500.0000 mg | ORAL_CAPSULE | Freq: Three times a day (TID) | ORAL | Status: AC

## 2011-08-19 NOTE — Progress Notes (Signed)
  Subjective:    Patient ID: Barry Vazquez, male    DOB: [birth year], 61 y.o.   MRN: [id number]  HPI  Sebaceous cyst posterior neck. This had been present for several years but recently has been increasing in size and increased redness and tenderness past couple days. No fever or chills. No drainage. No alleviating factors. No known drug allergies.  Review of Systems  Constitutional: Negative for fever and chills.       Objective:   Physical Exam  Constitutional: He appears well-developed and well-nourished.  Cardiovascular: Normal rate and regular rhythm.   Pulmonary/Chest: Effort normal and breath sounds normal. No respiratory distress. He has no wheezes. He has no rales.  Skin:       Patient has infected sebaceous cyst midline posterior neck. Area of erythema 2 x 2 centimeters. Tender and warm to touch. No obvious pustules.          Assessment & Plan:  Abscess sebaceous cyst. Discussed risk and benefits of incision and drainage and patient consented. Anesthetized 1% plain Xylocaine. Prep of Betadine. Using #11 blade linear incision one and one half centimeters horizontally. Obtained contents of sebaceous cyst capsule and some purulence. Patient tolerated well. Minimal bleeding.  cyst cavity packed with iodoform gauze. Followup on Monday to have packing removed

## 2011-08-23 ENCOUNTER — Encounter: Payer: Self-pay | Admitting: Family Medicine

## 2011-08-23 ENCOUNTER — Ambulatory Visit (INDEPENDENT_AMBULATORY_CARE_PROVIDER_SITE_OTHER): Payer: Managed Care, Other (non HMO) | Admitting: Family Medicine

## 2011-08-23 VITALS — BP 140/80 | Temp 97.8°F | Wt 255.0 lb

## 2011-08-23 DIAGNOSIS — L0211 Cutaneous abscess of neck: Secondary | ICD-10-CM

## 2011-08-23 NOTE — Progress Notes (Signed)
  Subjective:    Patient ID: Barry Vazquez, male    DOB: June 08, 1950, 61 y.o.   MRN: [id number]  HPI  Brief recheck for abscess neck. Abscessed sebaceous cyst incision and drainage on Thursday. Packing came out Friday after patient was pressing on the wound. He expressed some further pus. Minimal drainage since then. Much less tender and overall improved. Less edematous. No fever.  Review of Systems  Constitutional: Negative for fever and chills.       Objective:   Physical Exam  Constitutional: He appears well-developed and well-nourished.  Skin:       Patient has less erythema and no fluctuance posterior neck. No drainage expressed. Nontender. Wound is closed over from recent incision site          Assessment & Plan:  Abscessed sebaceous cyst posterior neck. Improved following I&D. Patient is aware cyst has not been fully excised and may regrow with time

## 2011-09-21 ENCOUNTER — Other Ambulatory Visit: Payer: Self-pay | Admitting: Family Medicine

## 2012-01-18 IMAGING — CT CT HEAD W/O CM
1 of 2 series · 16 of 30 positions shown, 20 images · non-contrast
Comparison: None.

CLINICAL DATA: Syncopal episode.

CT HEAD WITHOUT CONTRAST
TECHNIQUE: Contiguous axial images were obtained from the base of
the skull through the vertex without contrast.

[Series 3: recon 2: brain · axial · 0.49mm/px · z∈[+113,+258]mm · 16 of 64 slices shown, 20 images]
[im 4/64  brain]
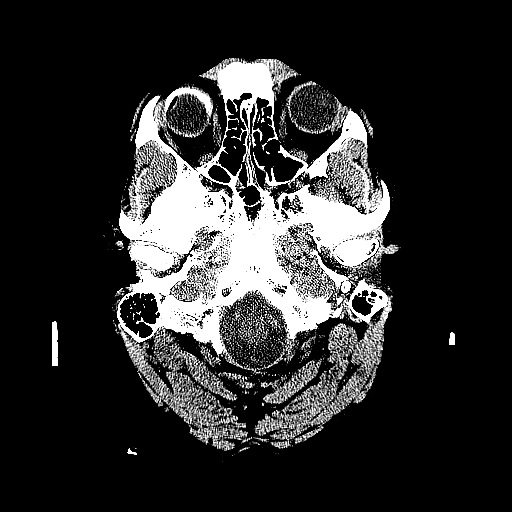
[im 4/64  bone]
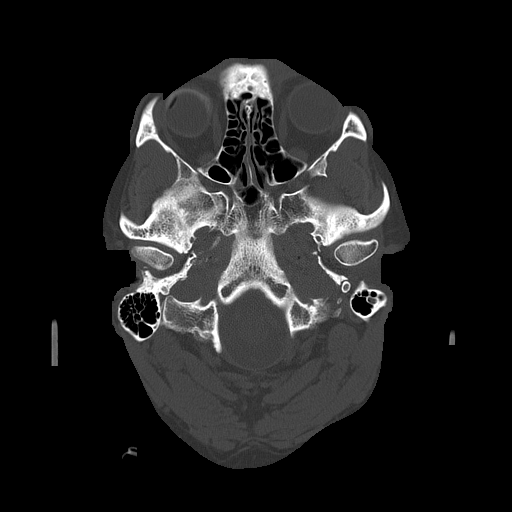
[im 7/64  brain]
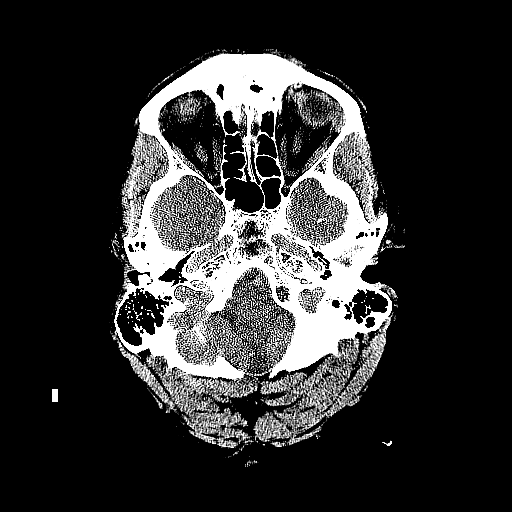
[im 10/64  brain]
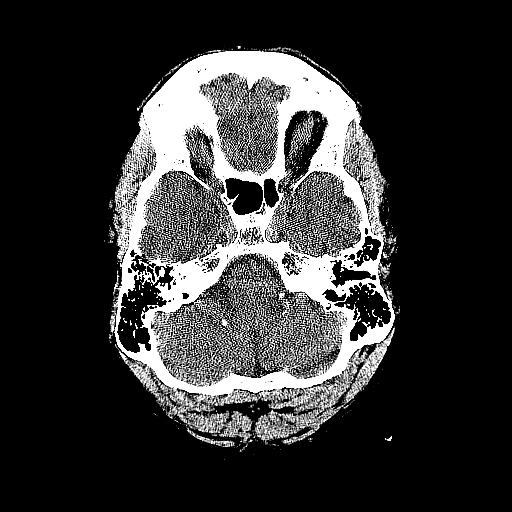
[im 14/64  brain]
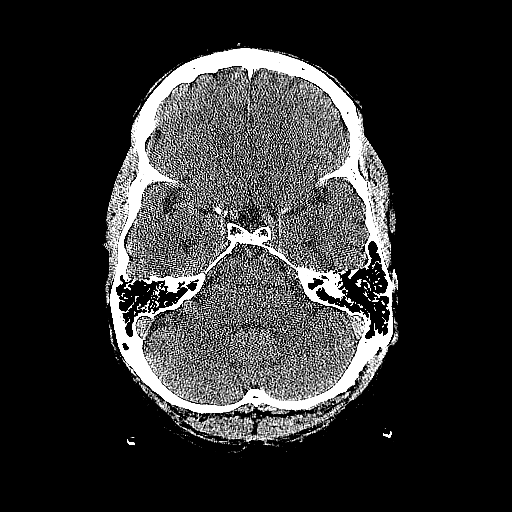
[im 20/64  brain]
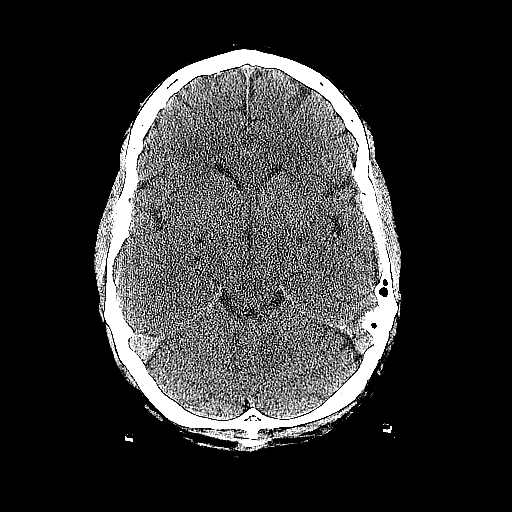
[im 20/64  bone]
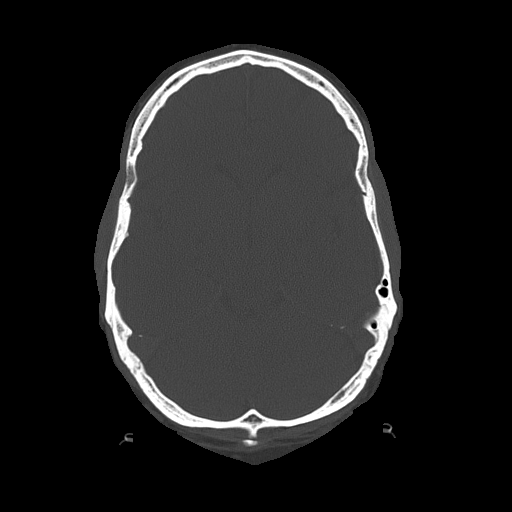
[im 24/64  brain]
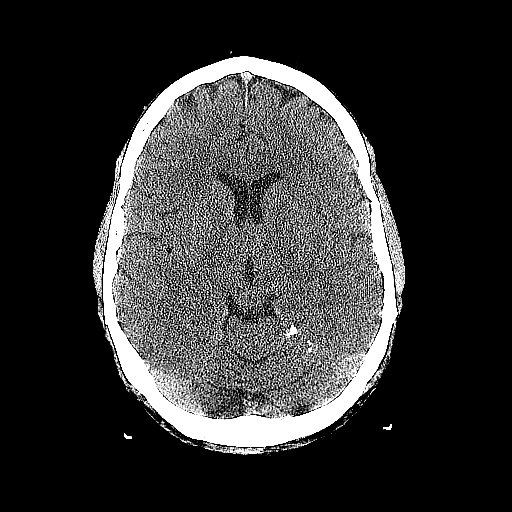
[im 27/64  brain]
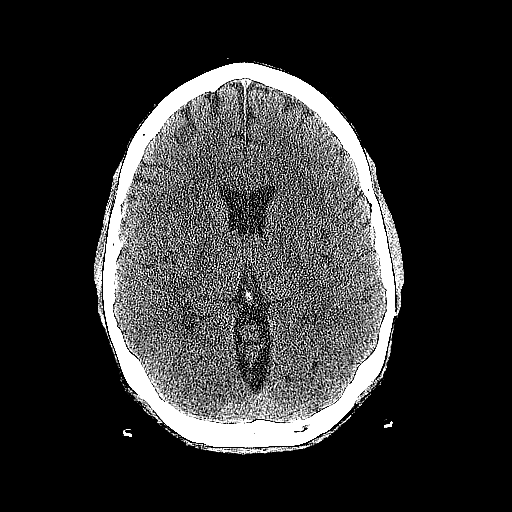
[im 30/64  brain]
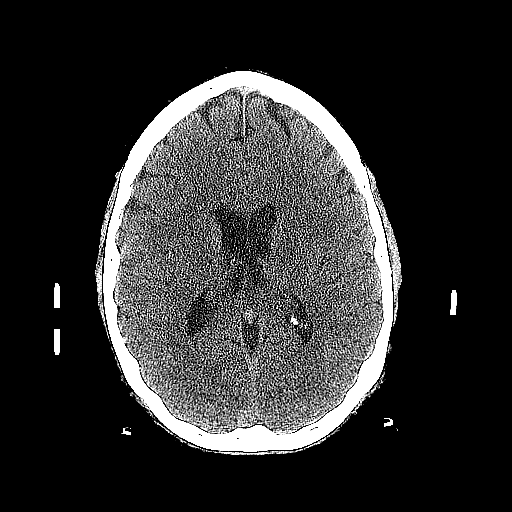
[im 34/64  brain]
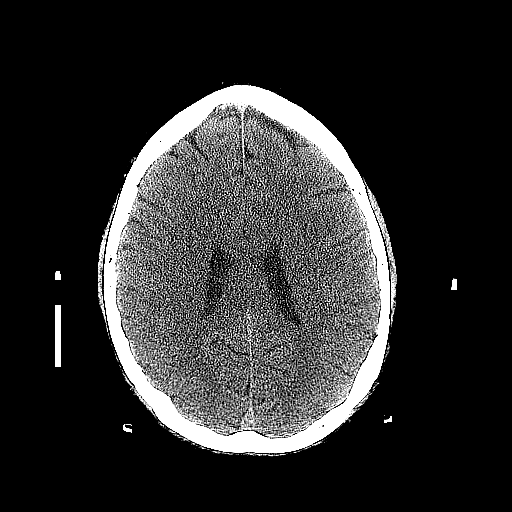
[im 34/64  bone]
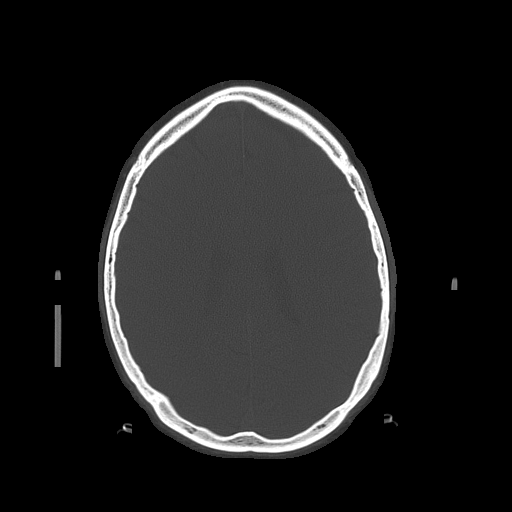
[im 37/64  brain]
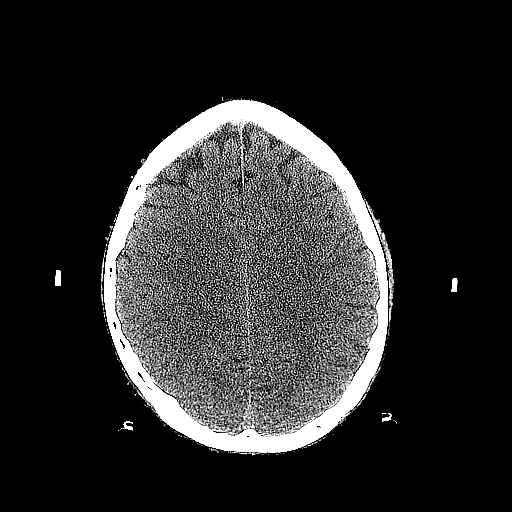
[im 40/64  brain]
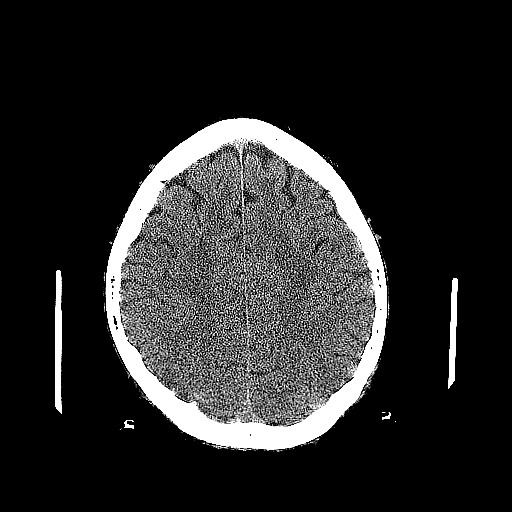
[im 44/64  brain]
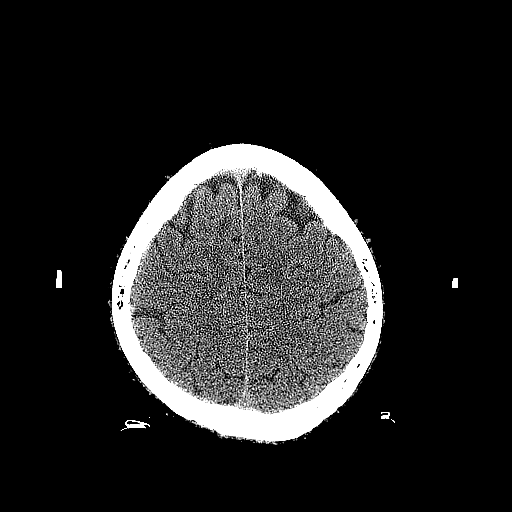
[im 50/64  brain]
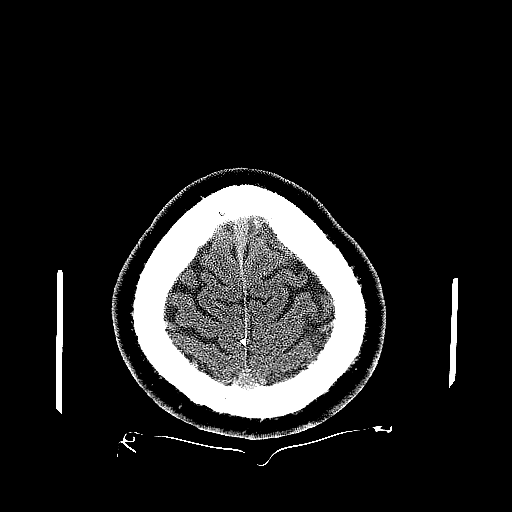
[im 50/64  bone]
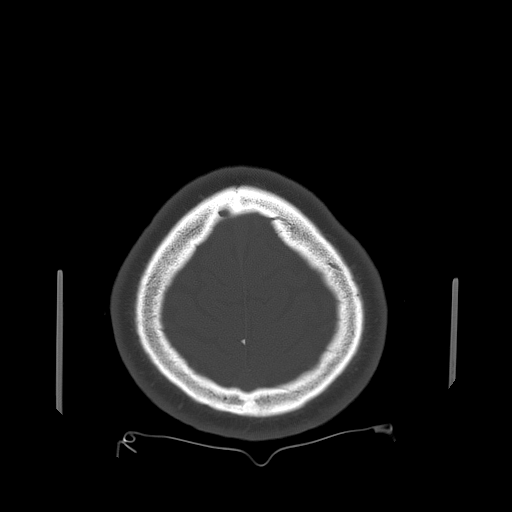
[im 54/64  brain]
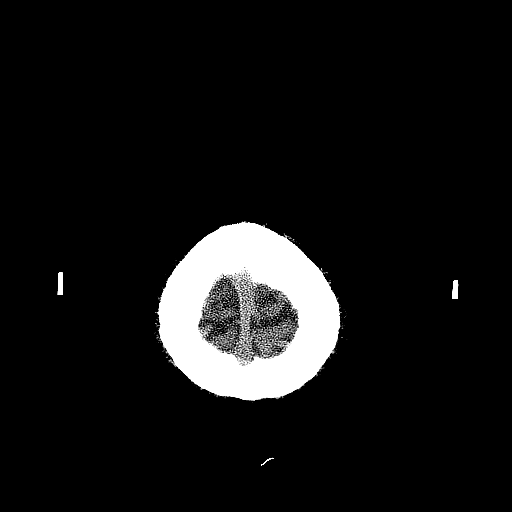
[im 57/64  brain]
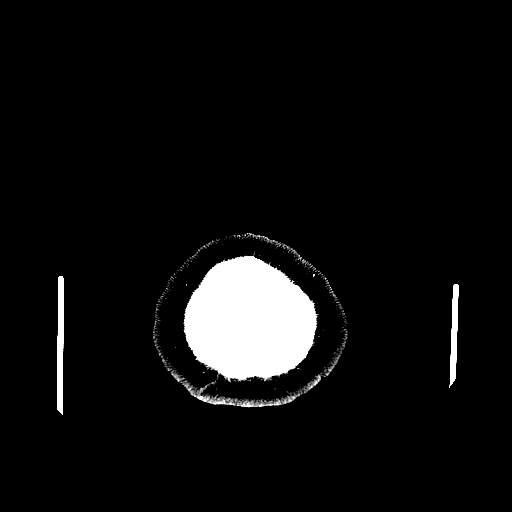
[im 60/64  brain]
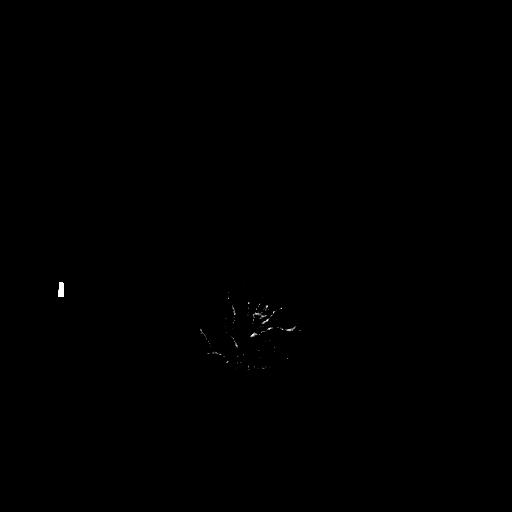

[16 of 30 positions shown; findings below may reference images not displayed]

FINDINGS: No acute intracranial abnormality is present.
Specifically, there is no evidence for acute infarct, hemorrhage,
mass, hydrocephalus, or extra-axial fluid collection.  The
paranasal sinuses and mastoid air cells are clear.  The globes and
orbits are intact.  The osseous skull is intact.
IMPRESSION: Negative CT of the head.

## 2012-04-15 ENCOUNTER — Other Ambulatory Visit: Payer: Self-pay | Admitting: Family Medicine

## 2012-04-16 ENCOUNTER — Other Ambulatory Visit: Payer: Self-pay | Admitting: Family Medicine

## 2012-04-17 ENCOUNTER — Telehealth: Payer: Self-pay | Admitting: Family Medicine

## 2012-04-17 DIAGNOSIS — E039 Hypothyroidism, unspecified: Secondary | ICD-10-CM

## 2012-04-17 MED ORDER — ATORVASTATIN CALCIUM 40 MG PO TABS: 40.0000 mg | ORAL_TABLET | Freq: Every day | ORAL | Status: DC

## 2012-04-17 MED ORDER — AMLODIPINE BESYLATE 5 MG PO TABS: 5.0000 mg | ORAL_TABLET | Freq: Every day | ORAL | Status: DC

## 2012-04-17 MED ORDER — LEVOTHYROXINE SODIUM 112 MCG PO TABS: 112.0000 ug | ORAL_TABLET | Freq: Every day | ORAL | Status: DC

## 2012-04-17 NOTE — Telephone Encounter (Signed)
Pt calling back again. Called this morning, but not phone call in [facility] yet. Requesting refills on generic Lipitor, generic synthroid, and BP meds. He is also requesting rx of Prednisone for gout attack - states he has had that med before. Last OV was April 2013. Pt uses [facility].

## 2012-04-17 NOTE — Telephone Encounter (Signed)
May refill prednisone once.   

## 2012-04-17 NOTE — Telephone Encounter (Signed)
I have filled requested meds for 3 months with 0 refills.  His last OV was in March 2013. Please advise re: prednisone

## 2012-04-19 MED ORDER — PREDNISONE 5 MG PO TABS: 5.0000 mg | ORAL_TABLET | Freq: Every day | ORAL | Status: DC

## 2012-06-10 ENCOUNTER — Ambulatory Visit (INDEPENDENT_AMBULATORY_CARE_PROVIDER_SITE_OTHER): Payer: BC Managed Care – PPO | Admitting: Emergency Medicine

## 2012-06-10 VITALS — BP 134/80 | HR 77 | Temp 97.8°F | Resp 18 | Ht 73.5 in | Wt 256.8 lb

## 2012-06-10 DIAGNOSIS — M109 Gout, unspecified: Secondary | ICD-10-CM

## 2012-06-10 MED ORDER — PREDNISONE 20 MG PO TABS: ORAL_TABLET | ORAL | Status: DC

## 2012-06-10 NOTE — Progress Notes (Signed)
  Subjective:    Patient ID: Barry Vazquez, male    DOB: 03-14-1951, 62 y.o.   MRN: [id number]  HPI Last pm, pain, itching and swelling L#2 MC/Phalangeal joint.  Pt has history of gout. Pt had flare up of gout 2 months ago.  Prednisone helps with symptoms. Patient is on 300mg  Allipurinol daily.   Review of Systems     Objective:   Physical Exam  Swelling, redness L/hand #2 metacarpal/phalangeal joint        Assessment & Plan:  Reviewed foods that trigger exacerbations of gout with pt. Prednisone - 9 day taper Continue Allipurinol 300mg  daily Recheck with [facility] PCP

## 2012-07-01 ENCOUNTER — Other Ambulatory Visit: Payer: Self-pay | Admitting: Family Medicine

## 2012-07-16 ENCOUNTER — Other Ambulatory Visit: Payer: Self-pay | Admitting: Family Medicine

## 2012-08-16 ENCOUNTER — Other Ambulatory Visit: Payer: Self-pay | Admitting: Family Medicine

## 2012-09-16 ENCOUNTER — Other Ambulatory Visit: Payer: Self-pay | Admitting: Family Medicine

## 2012-09-28 ENCOUNTER — Other Ambulatory Visit: Payer: Self-pay | Admitting: *Deleted

## 2012-09-28 MED ORDER — LEVOTHYROXINE SODIUM 112 MCG PO TABS: ORAL_TABLET | ORAL | Status: DC

## 2012-10-02 ENCOUNTER — Other Ambulatory Visit: Payer: Self-pay | Admitting: Family Medicine

## 2012-10-06 ENCOUNTER — Ambulatory Visit (INDEPENDENT_AMBULATORY_CARE_PROVIDER_SITE_OTHER): Payer: Managed Care, Other (non HMO) | Admitting: Family Medicine

## 2012-10-06 ENCOUNTER — Encounter: Payer: Self-pay | Admitting: Family Medicine

## 2012-10-06 VITALS — BP 132/88 | HR 70 | Temp 98.1°F | Resp 18 | Wt 259.0 lb

## 2012-10-06 DIAGNOSIS — H6121 Impacted cerumen, right ear: Secondary | ICD-10-CM

## 2012-10-06 DIAGNOSIS — I10 Essential (primary) hypertension: Secondary | ICD-10-CM

## 2012-10-06 DIAGNOSIS — E039 Hypothyroidism, unspecified: Secondary | ICD-10-CM

## 2012-10-06 DIAGNOSIS — M109 Gout, unspecified: Secondary | ICD-10-CM

## 2012-10-06 DIAGNOSIS — E785 Hyperlipidemia, unspecified: Secondary | ICD-10-CM

## 2012-10-06 LAB — HEPATIC FUNCTION PANEL
ALT: 37 U/L (ref 0–53)
Albumin: 4.6 g/dL (ref 3.5–5.2)
Alkaline Phosphatase: 60 U/L (ref 39–117)
Bilirubin, Direct: 0 mg/dL (ref 0.0–0.3)
Total Protein: 7.8 g/dL (ref 6.0–8.3)

## 2012-10-06 LAB — LIPID PANEL
Cholesterol: 180 mg/dL (ref 0–200)
LDL Cholesterol: 103 mg/dL — ABNORMAL HIGH (ref 0–99)
Triglycerides: 195 mg/dL — ABNORMAL HIGH (ref 0.0–149.0)
VLDL: 39 mg/dL (ref 0.0–40.0)

## 2012-10-06 LAB — BASIC METABOLIC PANEL
CO2: 23 mEq/L (ref 19–32)
Calcium: 9.9 mg/dL (ref 8.4–10.5)
GFR: 85.9 mL/min (ref >60.00)
Potassium: 4.6 mEq/L (ref 3.5–5.1)
Sodium: 140 mEq/L (ref 135–145)

## 2012-10-06 MED ORDER — AMLODIPINE BESY-BENAZEPRIL HCL 5-20 MG PO CAPS: 1.0000 | ORAL_CAPSULE | Freq: Every day | ORAL | Status: DC

## 2012-10-06 NOTE — Progress Notes (Signed)
  Subjective:    Patient ID: Barry Vazquez, male    DOB: 05-08-1950, 62 y.o.   MRN: [id number]  HPI Medical followup. Patient has history of hypertension, gout, hyperlipidemia, hypothyroidism. 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 Had bilateral knee injections with steroids earlier today  Blood pressures have been slightly elevated recently with several readings 140s systolic and around 90 diastolic. Overall feels well. Takes Lotrel 5/10 mg 1 daily. Compliant with all medications. No labs in over one year. No recent chest pains or dyspnea. Nonsmoker.  Past Medical History  Diagnosis Date  . HYPOTHYROIDISM 08/26/2008  . HYPERLIPIDEMIA 08/26/2008  . Gout, unspecified 02/20/2009  . Unspecified visual disturbance 10/14/2009  . EYE FLOATERS 02/20/2009  . HYPERTENSION 08/26/2008  . SHOULDER PAIN 09/11/2009  . SYNCOPE 04/14/2010  . Arthritis   . Cataract    Past Surgical History  Procedure Laterality Date  . Knee arthroscopy      left X 2  . Carpal tunnel release      left wrist  . Eye surgery    . Vasectomy      reports that he has never smoked. He does not have any smokeless tobacco history on file. He reports that  drinks alcohol. He reports that he does not use illicit drugs. family history includes Depression in his other. No Known Allergies    Review of Systems  Constitutional: Negative for fatigue.  Eyes: Negative for visual disturbance.  Respiratory: Negative for cough, chest tightness and shortness of breath.   Cardiovascular: Negative for chest pain, palpitations and leg swelling.  Neurological: Negative for dizziness, syncope, weakness, light-headedness and headaches.       Objective:   Physical Exam  Constitutional: He appears well-developed and well-nourished.  Neck: Neck supple. No thyromegaly present.  Cardiovascular: Normal rate and regular rhythm.   Pulmonary/Chest: Effort normal and breath sounds normal. No respiratory distress. He has no  wheezes. He has no rales.  Musculoskeletal: He exhibits no edema.          Assessment & Plan:  #1 hypertension. Slightly elevated today. Titrate Lotrel 5/20 mg 1 daily #2 hypothyroidism. Check TSH #3 hyperlipidemia. Check lipid and hepatic panel #4 history of gout. Continue allopurinol. He has done well with prophylaxis at this dosage.

## 2012-10-09 NOTE — Progress Notes (Signed)
Quick Note:  Pt informed ______ 

## 2012-10-12 ENCOUNTER — Other Ambulatory Visit: Payer: Self-pay | Admitting: Family Medicine

## 2012-10-20 ENCOUNTER — Other Ambulatory Visit: Payer: Self-pay | Admitting: Family Medicine

## 2012-10-30 ENCOUNTER — Other Ambulatory Visit: Payer: Self-pay

## 2012-10-30 MED ORDER — LEVOTHYROXINE SODIUM 112 MCG PO TABS: ORAL_TABLET | ORAL | Status: DC

## 2012-12-26 ENCOUNTER — Other Ambulatory Visit: Payer: Self-pay

## 2012-12-26 MED ORDER — ALLOPURINOL 300 MG PO TABS: ORAL_TABLET | ORAL | Status: DC

## 2013-03-05 ENCOUNTER — Other Ambulatory Visit: Payer: Self-pay

## 2013-03-05 MED ORDER — LEVOTHYROXINE SODIUM 112 MCG PO TABS: ORAL_TABLET | ORAL | Status: DC

## 2013-04-11 ENCOUNTER — Other Ambulatory Visit: Payer: Self-pay

## 2013-04-11 ENCOUNTER — Telehealth: Payer: Self-pay | Admitting: Family Medicine

## 2013-04-11 DIAGNOSIS — R400 Somnolence: Secondary | ICD-10-CM

## 2013-04-11 MED ORDER — ALLOPURINOL 300 MG PO TABS: ORAL_TABLET | ORAL | Status: DC

## 2013-04-11 NOTE — Telephone Encounter (Signed)
Referral made.  Let pt know.

## 2013-04-11 NOTE — Telephone Encounter (Signed)
Pt would like a referral for sleep apnea. Pt states that he and Dr. Caryl Never have "hinted" about this in the past. Please advise.

## 2013-04-12 NOTE — Telephone Encounter (Signed)
Pt informed

## 2013-05-23 ENCOUNTER — Institutional Professional Consult (permissible substitution): Payer: BC Managed Care – PPO | Admitting: Pulmonary Disease

## 2013-06-07 ENCOUNTER — Ambulatory Visit (INDEPENDENT_AMBULATORY_CARE_PROVIDER_SITE_OTHER): Payer: BC Managed Care – PPO | Admitting: Family Medicine

## 2013-06-07 ENCOUNTER — Encounter: Payer: Self-pay | Admitting: Family Medicine

## 2013-06-07 VITALS — BP 140/80 | HR 79 | Temp 97.9°F | Wt 259.0 lb

## 2013-06-07 DIAGNOSIS — M109 Gout, unspecified: Secondary | ICD-10-CM

## 2013-06-07 MED ORDER — PREDNISONE 20 MG PO TABS: ORAL_TABLET | ORAL | Status: DC

## 2013-06-07 NOTE — Patient Instructions (Signed)

## 2013-06-07 NOTE — Progress Notes (Signed)
Pre visit review using our clinic review tool, if applicable. No additional management support is needed unless otherwise documented below in the visit note. 

## 2013-06-07 NOTE — Progress Notes (Signed)
   Subjective:    Patient ID: Barry Vazquez, male    DOB: Jul 19, 1950, 63 y.o.   MRN: [id number]  HPI  history of gout. Acute left ankle pain for 2 days. No injury. No some warmth and swelling possibly mild erythema. He went on allopurinol last year his had very good success with less frequent flareups. No clear triggers. No recent fever or chills. He has responded very well to prednisone in the past. He has not had any recent uric acid levels.  Past Medical History  Diagnosis Date  . HYPOTHYROIDISM 08/26/2008  . HYPERLIPIDEMIA 08/26/2008  . Gout, unspecified 02/20/2009  . Unspecified visual disturbance 10/14/2009  . EYE FLOATERS 02/20/2009  . HYPERTENSION 08/26/2008  . SHOULDER PAIN 09/11/2009  . SYNCOPE 04/14/2010  . Arthritis   . Cataract    Past Surgical History  Procedure Laterality Date  . Knee arthroscopy      left X 2  . Carpal tunnel release      left wrist  . Eye surgery    . Vasectomy      reports that he has never smoked. He does not have any smokeless tobacco history on file. He reports that he drinks alcohol. He reports that he does not use illicit drugs. family history includes Depression in his other. No Known Allergies    Review of Systems  Constitutional: Negative for fever and chills.  Skin: Negative for rash.       Objective:   Physical Exam  Constitutional: He appears well-developed and well-nourished.  Cardiovascular: Normal rate.   Pulmonary/Chest: Effort normal and breath sounds normal. No respiratory distress. He has no wheezes. He has no rales.  Musculoskeletal:  Left ankle reveals mild warmth and mild edema. He has some tenderness mostly on the lateral aspect. No specific bony tenderness. Achilles tendon is intact and nontender. No skin rashes.  Skin: No rash noted.          Assessment & Plan:  Acute left ankle pain. Strongly suspect acute gout. Prednisone taper. We have suggested uric acid with followup labs next summer. Touch base  in a few days if pain is not resolving. Continue allopurinol for prophylaxis.

## 2013-09-07 ENCOUNTER — Telehealth: Payer: Self-pay | Admitting: Family Medicine

## 2013-09-07 MED ORDER — LEVOTHYROXINE SODIUM 112 MCG PO TABS: ORAL_TABLET | ORAL | Status: DC

## 2013-09-07 NOTE — Telephone Encounter (Signed)
Rx sent to pharmacy   

## 2013-09-07 NOTE — Telephone Encounter (Signed)
[id number] [place], [place] - [address] is requesting re-fill on levothyroxine (SYNTHROID, LEVOTHROID) 112 MCG tablet ° °

## 2013-10-07 ENCOUNTER — Other Ambulatory Visit: Payer: Self-pay | Admitting: Family Medicine

## 2013-10-20 ENCOUNTER — Other Ambulatory Visit: Payer: Self-pay | Admitting: Family Medicine

## 2013-10-22 ENCOUNTER — Telehealth: Payer: Self-pay | Admitting: Family Medicine

## 2013-10-22 MED ORDER — AMLODIPINE BESY-BENAZEPRIL HCL 5-20 MG PO CAPS: 1.0000 | ORAL_CAPSULE | Freq: Every day | ORAL | Status: DC

## 2013-10-22 NOTE — Telephone Encounter (Signed)
Rx sent to pharmacy   

## 2013-10-22 NOTE — Telephone Encounter (Signed)
[id number] [place], [place] - [address] is requesting 90 day re-fill on amLODipine-benazepril (LOTREL) 5-20 MG per capsule

## 2013-12-07 ENCOUNTER — Telehealth: Payer: Self-pay | Admitting: Family Medicine

## 2013-12-07 MED ORDER — LEVOTHYROXINE SODIUM 112 MCG PO TABS: ORAL_TABLET | ORAL | Status: DC

## 2013-12-07 NOTE — Telephone Encounter (Signed)
RX sent to pharmacy  

## 2013-12-07 NOTE — Telephone Encounter (Signed)
[id number] [place], [place] - [address] is requesting re-fill on levothyroxine (SYNTHROID, LEVOTHROID) 112 MCG tablet

## 2014-03-01 ENCOUNTER — Other Ambulatory Visit: Payer: Self-pay | Admitting: Family Medicine

## 2014-06-13 ENCOUNTER — Other Ambulatory Visit: Payer: Self-pay | Admitting: Family Medicine

## 2014-07-11 ENCOUNTER — Other Ambulatory Visit: Payer: Self-pay | Admitting: Family Medicine

## 2014-07-13 ENCOUNTER — Other Ambulatory Visit: Payer: Self-pay | Admitting: Family Medicine

## 2014-07-19 ENCOUNTER — Other Ambulatory Visit (INDEPENDENT_AMBULATORY_CARE_PROVIDER_SITE_OTHER): Payer: BLUE CROSS/BLUE SHIELD

## 2014-07-19 DIAGNOSIS — R7989 Other specified abnormal findings of blood chemistry: Secondary | ICD-10-CM

## 2014-07-19 DIAGNOSIS — Z0000 Encounter for general adult medical examination without abnormal findings: Secondary | ICD-10-CM

## 2014-07-19 LAB — BASIC METABOLIC PANEL
BUN: 16 mg/dL (ref 6–23)
CO2: 33 meq/L — AB (ref 19–32)
Calcium: 9.5 mg/dL (ref 8.4–10.5)
Chloride: 103 mEq/L (ref 96–112)
Creatinine, Ser: 0.93 mg/dL (ref 0.40–1.50)
GFR: 87.52 mL/min (ref >60.00)
GLUCOSE: 118 mg/dL — AB (ref 70–99)
POTASSIUM: 5 meq/L (ref 3.5–5.1)
Sodium: 141 mEq/L (ref 135–145)

## 2014-07-19 LAB — CBC WITH DIFFERENTIAL/PLATELET
Basophils Absolute: 0.1 10*3/uL (ref 0.0–0.1)
Basophils Relative: 0.6 % (ref 0.0–3.0)
EOS PCT: 2.5 % (ref 0.0–5.0)
Eosinophils Absolute: 0.2 10*3/uL (ref 0.0–0.7)
HEMATOCRIT: 45.7 % (ref 39.0–52.0)
Hemoglobin: 15.6 g/dL (ref 13.0–17.0)
Lymphocytes Relative: 37.5 % (ref 12.0–46.0)
Lymphs Abs: 3.3 10*3/uL (ref 0.7–4.0)
MCHC: 34.1 g/dL (ref 30.0–36.0)
MCV: 88.8 fl (ref 78.0–100.0)
MONOS PCT: 7.1 % (ref 3.0–12.0)
Monocytes Absolute: 0.6 10*3/uL (ref 0.1–1.0)
Neutro Abs: 4.6 10*3/uL (ref 1.4–7.7)
Neutrophils Relative %: 52.3 % (ref 43.0–77.0)
PLATELETS: 225 10*3/uL (ref 150.0–400.0)
RBC: 5.14 Mil/uL (ref 4.22–5.81)
RDW: 13.9 % (ref 11.5–15.5)
WBC: 8.7 10*3/uL (ref 4.0–10.5)

## 2014-07-19 LAB — HEPATIC FUNCTION PANEL
ALBUMIN: 4.3 g/dL (ref 3.5–5.2)
ALT: 49 U/L (ref 0–53)
AST: 23 U/L (ref 0–37)
Alkaline Phosphatase: 69 U/L (ref 39–117)
BILIRUBIN TOTAL: 0.7 mg/dL (ref 0.2–1.2)
Bilirubin, Direct: 0.1 mg/dL (ref 0.0–0.3)
Total Protein: 6.7 g/dL (ref 6.0–8.3)

## 2014-07-19 LAB — LIPID PANEL
CHOL/HDL RATIO: 4
CHOLESTEROL: 173 mg/dL (ref 0–200)
HDL: 39.6 mg/dL (ref >39.00)
NONHDL: 133.4
Triglycerides: 206 mg/dL — ABNORMAL HIGH (ref 0.0–149.0)
VLDL: 41.2 mg/dL — AB (ref 0.0–40.0)

## 2014-07-19 LAB — PSA: PSA: 1.24 ng/mL (ref 0.10–4.00)

## 2014-07-19 LAB — LDL CHOLESTEROL, DIRECT: LDL DIRECT: 97 mg/dL

## 2014-07-19 LAB — TSH: TSH: 4.25 u[IU]/mL (ref 0.35–4.50)

## 2014-07-23 ENCOUNTER — Encounter: Payer: Self-pay | Admitting: Family Medicine

## 2014-07-23 ENCOUNTER — Ambulatory Visit (INDEPENDENT_AMBULATORY_CARE_PROVIDER_SITE_OTHER): Payer: BLUE CROSS/BLUE SHIELD | Admitting: Family Medicine

## 2014-07-23 VITALS — BP 140/80 | HR 68 | Temp 98.0°F | Ht 73.0 in | Wt 260.0 lb

## 2014-07-23 DIAGNOSIS — Z0000 Encounter for general adult medical examination without abnormal findings: Secondary | ICD-10-CM

## 2014-07-23 DIAGNOSIS — D225 Melanocytic nevi of trunk: Secondary | ICD-10-CM

## 2014-07-23 NOTE — Progress Notes (Signed)
Subjective:    Patient ID: Barry Vazquez, male    DOB: 1951-03-27, 64 y.o.   MRN: [id number]  HPI Patient seen for physical exam. He has chronic problems consisting of hypothyroidism, hyperlipidemia, hypertension, and gout. He's had past history of syncope which has been worked up per cardiology. Unremarkable event monitor and echocardiogram. He still has occasional periods of dizziness but no recent syncope. No history of seizure. He has hypertension treated with amlodipine and benazepril. No consistent orthostatic symptoms. No chest pains.  No consistent exercise. Poor dietary compliance. Not monitoring blood pressures regularly. No symptoms of polyuria or polydipsia. No history of shingles vaccine. Colonoscopy up-to-date.  Past Medical History  Diagnosis Date  . HYPOTHYROIDISM 08/26/2008  . HYPERLIPIDEMIA 08/26/2008  . Gout, unspecified 02/20/2009  . Unspecified visual disturbance 10/14/2009  . EYE FLOATERS 02/20/2009  . HYPERTENSION 08/26/2008  . SHOULDER PAIN 09/11/2009  . SYNCOPE 04/14/2010  . Arthritis   . Cataract    Past Surgical History  Procedure Laterality Date  . Knee arthroscopy      left X 2  . Carpal tunnel release      left wrist  . Eye surgery    . Vasectomy      reports that he has never smoked. He does not have any smokeless tobacco history on file. He reports that he drinks alcohol. He reports that he does not use illicit drugs. family history includes Depression in his other. No Known Allergies    Review of Systems  Constitutional: Negative for fever, activity change, appetite change, fatigue and unexpected weight change.  HENT: Negative for congestion, ear pain and trouble swallowing.   Eyes: Negative for pain and visual disturbance.  Respiratory: Negative for cough, shortness of breath and wheezing.   Cardiovascular: Negative for chest pain and palpitations.  Gastrointestinal: Negative for nausea, vomiting, abdominal pain, diarrhea, constipation,  blood in stool, abdominal distention and rectal pain.  Endocrine: Negative for polydipsia and polyuria.  Genitourinary: Negative for dysuria, hematuria and testicular pain.  Musculoskeletal: Negative for joint swelling and arthralgias.  Skin: Negative for rash.  Neurological: Negative for dizziness, syncope and headaches.  Hematological: Negative for adenopathy.  Psychiatric/Behavioral: Negative for confusion and dysphoric mood.       Objective:   Physical Exam  Constitutional: He is oriented to person, place, and time. He appears well-developed and well-nourished. No distress.  HENT:  Head: Normocephalic and atraumatic.  Right Ear: External ear normal.  Left Ear: External ear normal.  Mouth/Throat: Oropharynx is clear and moist.  Eyes: Conjunctivae and EOM are normal. Pupils are equal, round, and reactive to light.  Neck: Normal range of motion. Neck supple. No thyromegaly present.  Cardiovascular: Normal rate, regular rhythm and normal heart sounds.   No murmur heard. Pulmonary/Chest: No respiratory distress. He has no wheezes. He has no rales.  Abdominal: Soft. Bowel sounds are normal. He exhibits no distension and no mass. There is no tenderness. There is no rebound and no guarding.  Musculoskeletal: He exhibits no edema.  Lymphadenopathy:    He has no cervical adenopathy.  Neurological: He is alert and oriented to person, place, and time. He displays normal reflexes. No cranial nerve deficit.  Skin: No rash noted.  Patient has several nevi on his trunk. Right mid thoracic area he has somewhat oval-shaped very darkened multicolored nevus about 6-7 mm diameter. Mostly symmetric  Psychiatric: He has a normal mood and affect.          Assessment & Plan:  Complete physical.  Labs reviewed. He has dyslipidemia and prediabetes. Meets criteria for metabolic syndrome. Blood pressure is not well controlled but given his prior history of syncope we reluctant to add additional blood  pressure medications. DASH diet discussed. Recommend starting consistent aerobic exercise and lose some weight and reassess one month. Dermatology referral for atypical nevus. Check on coverage for shingles vaccine.

## 2014-07-23 NOTE — Progress Notes (Signed)
Pre visit review using our clinic review tool, if applicable. No additional management support is needed unless otherwise documented below in the visit note. 

## 2014-07-23 NOTE — Patient Instructions (Signed)
Check on coverage for shingles vaccine.   Lose some weight and establish more consistent exercise. Monitor BP and be in touch if consistently > 150/90. We will call you with dermatology referral.   DASH Eating Plan DASH stands for "Dietary Approaches to Stop Hypertension." The DASH eating plan is a healthy eating plan that has been shown to reduce high blood pressure (hypertension). Additional health benefits may include reducing the risk of type 2 diabetes mellitus, heart disease, and stroke. The DASH eating plan may also help with weight loss. WHAT DO I NEED TO KNOW ABOUT THE DASH EATING PLAN? For the DASH eating plan, you will follow these general guidelines:  Choose foods with a percent daily value for sodium of less than 5% (as listed on the food label).  Use salt-free seasonings or herbs instead of table salt or sea salt.  Check with your health care provider or pharmacist before using salt substitutes.  Eat lower-sodium products, often labeled as "lower sodium" or "no salt added."  Eat fresh foods.  Eat more vegetables, fruits, and low-fat dairy products.  Choose whole grains. Look for the word "whole" as the first word in the ingredient list.  Choose fish and skinless chicken or [place] more often than red meat. Limit fish, poultry, and meat to 6 oz (170 g) each day.  Limit sweets, desserts, sugars, and sugary drinks.  Choose heart-healthy fats.  Limit cheese to 1 oz (28 g) per day.  Eat more home-cooked food and less restaurant, buffet, and fast food.  Limit fried foods.  Cook foods using methods other than frying.  Limit canned vegetables. If you do use them, rinse them well to decrease the sodium.  When eating at a restaurant, ask that your food be prepared with less salt, or no salt if possible. WHAT FOODS CAN I EAT? Seek help from a dietitian for individual calorie needs. Grains Whole grain or whole wheat bread. Brown rice. Whole grain or whole wheat pasta.  Quinoa, bulgur, and whole grain cereals. Low-sodium cereals. Corn or whole wheat flour tortillas. Whole grain cornbread. Whole grain crackers. Low-sodium crackers. Vegetables Fresh or frozen vegetables (raw, steamed, roasted, or grilled). Low-sodium or reduced-sodium tomato and vegetable juices. Low-sodium or reduced-sodium tomato sauce and paste. Low-sodium or reduced-sodium canned vegetables.  Fruits All fresh, canned (in natural juice), or frozen fruits. Meat and Other Protein Products Ground beef (85% or leaner), grass-fed beef, or beef trimmed of fat. Skinless chicken or [place]. Ground chicken or [place]. Pork trimmed of fat. All fish and seafood. Eggs. Dried beans, peas, or lentils. Unsalted nuts and seeds. Unsalted canned beans. Dairy Low-fat dairy products, such as skim or 1% milk, 2% or reduced-fat cheeses, low-fat ricotta or cottage cheese, or plain low-fat yogurt. Low-sodium or reduced-sodium cheeses. Fats and Oils Tub margarines without trans fats. Light or reduced-fat mayonnaise and salad dressings (reduced sodium). Avocado. Safflower, olive, or canola oils. Natural peanut or almond butter. Other Unsalted popcorn and pretzels. The items listed above may not be a complete list of recommended foods or beverages. Contact your dietitian for more options. WHAT FOODS ARE NOT RECOMMENDED? Grains White bread. White pasta. White rice. Refined cornbread. Bagels and croissants. Crackers that contain trans fat. Vegetables Creamed or fried vegetables. Vegetables in a cheese sauce. Regular canned vegetables. Regular canned tomato sauce and paste. Regular tomato and vegetable juices. Fruits Dried fruits. Canned fruit in light or heavy syrup. Fruit juice. Meat and Other Protein Products Fatty cuts of meat. Ribs, chicken wings, bacon,  sausage, bologna, salami, chitterlings, fatback, hot dogs, bratwurst, and packaged luncheon meats. Salted nuts and seeds. Canned beans with salt. Dairy Whole or 2%  milk, cream, half-and-half, and cream cheese. Whole-fat or sweetened yogurt. Full-fat cheeses or blue cheese. Nondairy creamers and whipped toppings. Processed cheese, cheese spreads, or cheese curds. Condiments Onion and garlic salt, seasoned salt, table salt, and sea salt. Canned and packaged gravies. Worcestershire sauce. Tartar sauce. Barbecue sauce. Teriyaki sauce. Soy sauce, including reduced sodium. Steak sauce. Fish sauce. Oyster sauce. Cocktail sauce. Horseradish. Ketchup and mustard. Meat flavorings and tenderizers. Bouillon cubes. Hot sauce. Tabasco sauce. Marinades. Taco seasonings. Relishes. Fats and Oils Butter, stick margarine, lard, shortening, ghee, and bacon fat. Coconut, palm kernel, or palm oils. Regular salad dressings. Other Pickles and olives. Salted popcorn and pretzels. The items listed above may not be a complete list of foods and beverages to avoid. Contact your dietitian for more information. WHERE CAN I FIND MORE INFORMATION? National Heart, Lung, and Blood Institute: travelstabloid.com Document Released: 04/08/2011 Document Revised: 09/03/2013 Document Reviewed: 02/21/2013 [facility] Patient Information 2015 [place], [place]. This information is not intended to replace advice given to you by your health care provider. Make sure you discuss any questions you have with your health care provider.

## 2014-07-24 ENCOUNTER — Telehealth: Payer: Self-pay | Admitting: Family Medicine

## 2014-07-24 MED ORDER — LEVOTHYROXINE SODIUM 112 MCG PO TABS: ORAL_TABLET | ORAL | Status: DC

## 2014-07-24 MED ORDER — ATORVASTATIN CALCIUM 40 MG PO TABS: 40.0000 mg | ORAL_TABLET | Freq: Every day | ORAL | Status: DC

## 2014-07-24 MED ORDER — ALLOPURINOL 300 MG PO TABS: 300.0000 mg | ORAL_TABLET | Freq: Every day | ORAL | Status: DC

## 2014-07-24 NOTE — Telephone Encounter (Signed)
Patient is requesting re-fills on the following: levothyroxine (SYNTHROID, LEVOTHROID) 112 MCG tablet, atorvastatin (LIPITOR) 40 MG tablet, and allopurinol (ZYLOPRIM) 300 MG tablet.  He states that he need 90 day fill on all of them and he is completely out of levothyroxine.  [id number] [place], [place]

## 2014-07-24 NOTE — Telephone Encounter (Signed)
Rx's sent to pharmacy.  

## 2014-08-30 ENCOUNTER — Ambulatory Visit (INDEPENDENT_AMBULATORY_CARE_PROVIDER_SITE_OTHER): Payer: BLUE CROSS/BLUE SHIELD | Admitting: Family Medicine

## 2014-08-30 ENCOUNTER — Encounter: Payer: Self-pay | Admitting: Family Medicine

## 2014-08-30 VITALS — BP 140/80 | HR 70 | Temp 98.2°F | Wt 251.0 lb

## 2014-08-30 DIAGNOSIS — R739 Hyperglycemia, unspecified: Secondary | ICD-10-CM | POA: Diagnosis not present

## 2014-08-30 DIAGNOSIS — I10 Essential (primary) hypertension: Secondary | ICD-10-CM

## 2014-08-30 DIAGNOSIS — M79675 Pain in left toe(s): Secondary | ICD-10-CM

## 2014-08-30 DIAGNOSIS — E119 Type 2 diabetes mellitus without complications: Secondary | ICD-10-CM | POA: Insufficient documentation

## 2014-08-30 LAB — GLUCOSE, POCT (MANUAL RESULT ENTRY): POC Glucose: 117 mg/dl — AB (ref 70–99)

## 2014-08-30 NOTE — Progress Notes (Signed)
   Subjective:    Patient ID: Barry Vazquez, male    DOB: Apr 28, 1951, 64 y.o.   MRN: [id number]  HPI Patient seen for medical follow-up. Refer to prior note from recent physical. He had some weight gain and poor compliance with diet and exercise. Since that time he's made some dramatic changes. He is already lost 9 pounds in one month. He is exercising more and has restricted calorie intake. His problems include hypertension, poorly controlled, and hyperglycemia. Recent fasting glucose 118. We elected not to add additional medications  He denies any headaches or dizziness. He has separate problem of left fifth toe pain. He thinks he may broken that toe along with a couple others related to injury several years ago. He has noticed a nodule in the lateral aspect of the left fifth toe which is sometimes painful with certain shoes. Overall this is not impairing ambulation.\  Past Medical History  Diagnosis Date  . HYPOTHYROIDISM 08/26/2008  . HYPERLIPIDEMIA 08/26/2008  . Gout, unspecified 02/20/2009  . Unspecified visual disturbance 10/14/2009  . EYE FLOATERS 02/20/2009  . HYPERTENSION 08/26/2008  . SHOULDER PAIN 09/11/2009  . SYNCOPE 04/14/2010  . Arthritis   . Cataract    Past Surgical History  Procedure Laterality Date  . Knee arthroscopy      left X 2  . Carpal tunnel release      left wrist  . Eye surgery    . Vasectomy      reports that he has never smoked. He does not have any smokeless tobacco history on file. He reports that he drinks alcohol. He reports that he does not use illicit drugs. family history includes Depression in his other. No Known Allergies    Review of Systems  Constitutional: Negative for fever, chills, appetite change, fatigue and unexpected weight change.  Eyes: Negative for visual disturbance.  Respiratory: Negative for cough, chest tightness and shortness of breath.   Cardiovascular: Negative for chest pain, palpitations and leg swelling.  Endocrine:  Negative for polydipsia and polyuria.  Neurological: Negative for dizziness, syncope, weakness, light-headedness and headaches.       Objective:   Physical Exam  Constitutional: He is oriented to person, place, and time. He appears well-developed and well-nourished.  HENT:  Right Ear: External ear normal.  Left Ear: External ear normal.  Mouth/Throat: Oropharynx is clear and moist.  Eyes: Pupils are equal, round, and reactive to light.  Neck: Neck supple. No thyromegaly present.  Cardiovascular: Normal rate and regular rhythm.   Pulmonary/Chest: Effort normal and breath sounds normal. No respiratory distress. He has no wheezes. He has no rales.  Musculoskeletal: He exhibits no edema.  Left fifth toe reveals bony nodular density near the base which is nontender. No overlying erythema. No ulceration  Neurological: He is alert and oriented to person, place, and time.          Assessment & Plan:  #1 hypertension. Repeat large cuff right arm seated 150/80. We elected not to add additional medication since had great success with weight loss in just one month. Reassess in 3 months. Continue home monitoring #2 hyperglycemia. Recent fasting blood sugar 118. Should improve with weight loss. Repeat fasting sugar today. Consider A1c at follow-up #3 nodule left fifth toe. Suspect either callus from remote fracture or possibly hypertrophic osteoarthritis change. Offer orthopedic or podiatry referral and he wishes to wait at this time.

## 2014-08-30 NOTE — Patient Instructions (Signed)
Continue weight loss efforts Continue regular aerobic exercise. Alcohol in moderation-no more than 12 ounces of wine per day or 24 ounces beer per day Continue to restrict simple sugars and starches

## 2014-08-30 NOTE — Progress Notes (Signed)
Pre visit review using our clinic review tool, if applicable. No additional management support is needed unless otherwise documented below in the visit note. 

## 2014-10-18 ENCOUNTER — Other Ambulatory Visit: Payer: Self-pay | Admitting: Family Medicine

## 2015-07-18 ENCOUNTER — Other Ambulatory Visit: Payer: Self-pay | Admitting: Family Medicine

## 2015-07-29 ENCOUNTER — Other Ambulatory Visit: Payer: Self-pay | Admitting: Family Medicine

## 2015-08-11 ENCOUNTER — Other Ambulatory Visit: Payer: Self-pay | Admitting: Family Medicine

## 2015-08-15 ENCOUNTER — Other Ambulatory Visit: Payer: Self-pay | Admitting: Family Medicine

## 2015-12-04 ENCOUNTER — Other Ambulatory Visit: Payer: Self-pay | Admitting: Family Medicine

## 2015-12-18 ENCOUNTER — Other Ambulatory Visit (INDEPENDENT_AMBULATORY_CARE_PROVIDER_SITE_OTHER): Payer: 59

## 2015-12-18 DIAGNOSIS — Z0000 Encounter for general adult medical examination without abnormal findings: Secondary | ICD-10-CM | POA: Diagnosis not present

## 2015-12-18 LAB — HEPATIC FUNCTION PANEL
ALBUMIN: 4.4 g/dL (ref 3.5–5.2)
ALK PHOS: 61 U/L (ref 39–117)
ALT: 33 U/L (ref 0–53)
AST: 22 U/L (ref 0–37)
Bilirubin, Direct: 0.2 mg/dL (ref 0.0–0.3)
TOTAL PROTEIN: 6.6 g/dL (ref 6.0–8.3)
Total Bilirubin: 0.9 mg/dL (ref 0.2–1.2)

## 2015-12-18 LAB — BASIC METABOLIC PANEL
BUN: 16 mg/dL (ref 6–23)
CO2: 30 meq/L (ref 19–32)
Calcium: 9.6 mg/dL (ref 8.4–10.5)
Chloride: 102 mEq/L (ref 96–112)
Creatinine, Ser: 0.89 mg/dL (ref 0.40–1.50)
GFR: 91.65 mL/min (ref >60.00)
GLUCOSE: 123 mg/dL — AB (ref 70–99)
POTASSIUM: 4.7 meq/L (ref 3.5–5.1)
SODIUM: 139 meq/L (ref 135–145)

## 2015-12-18 LAB — CBC WITH DIFFERENTIAL/PLATELET
BASOS ABS: 0.1 10*3/uL (ref 0.0–0.1)
Basophils Relative: 0.8 % (ref 0.0–3.0)
EOS PCT: 3.2 % (ref 0.0–5.0)
Eosinophils Absolute: 0.2 10*3/uL (ref 0.0–0.7)
HEMATOCRIT: 42.1 % (ref 39.0–52.0)
Hemoglobin: 14.6 g/dL (ref 13.0–17.0)
LYMPHS ABS: 2.6 10*3/uL (ref 0.7–4.0)
LYMPHS PCT: 35.9 % (ref 12.0–46.0)
MCHC: 34.7 g/dL (ref 30.0–36.0)
MCV: 89.2 fl (ref 78.0–100.0)
MONOS PCT: 9 % (ref 3.0–12.0)
Monocytes Absolute: 0.6 10*3/uL (ref 0.1–1.0)
NEUTROS ABS: 3.6 10*3/uL (ref 1.4–7.7)
NEUTROS PCT: 51.1 % (ref 43.0–77.0)
Platelets: 206 10*3/uL (ref 150.0–400.0)
RBC: 4.72 Mil/uL (ref 4.22–5.81)
RDW: 13.9 % (ref 11.5–15.5)
WBC: 7.1 10*3/uL (ref 4.0–10.5)

## 2015-12-18 LAB — LIPID PANEL
CHOLESTEROL: 148 mg/dL (ref 0–200)
HDL: 37.5 mg/dL — ABNORMAL LOW (ref >39.00)
LDL Cholesterol: 84 mg/dL (ref 0–99)
NONHDL: 110.91
Total CHOL/HDL Ratio: 4
Triglycerides: 137 mg/dL (ref 0.0–149.0)
VLDL: 27.4 mg/dL (ref 0.0–40.0)

## 2015-12-18 LAB — TSH: TSH: 2.3 u[IU]/mL (ref 0.35–4.50)

## 2015-12-18 LAB — PSA: PSA: 1.62 ng/mL (ref 0.10–4.00)

## 2015-12-23 ENCOUNTER — Ambulatory Visit (INDEPENDENT_AMBULATORY_CARE_PROVIDER_SITE_OTHER): Payer: 59 | Admitting: Family Medicine

## 2015-12-23 ENCOUNTER — Encounter: Payer: Self-pay | Admitting: Family Medicine

## 2015-12-23 VITALS — BP 120/80 | HR 70 | Temp 98.0°F | Ht 73.0 in | Wt 249.6 lb

## 2015-12-23 DIAGNOSIS — Z23 Encounter for immunization: Secondary | ICD-10-CM | POA: Diagnosis not present

## 2015-12-23 DIAGNOSIS — Z0000 Encounter for general adult medical examination without abnormal findings: Secondary | ICD-10-CM | POA: Diagnosis not present

## 2015-12-23 NOTE — Patient Instructions (Signed)
Check on coverage for shingles vaccine and let us know if interested.  

## 2015-12-23 NOTE — Progress Notes (Signed)
Pre visit review using our clinic review tool, if applicable. No additional management support is needed unless otherwise documented below in the visit note. 

## 2015-12-23 NOTE — Progress Notes (Signed)
Subjective:     Patient ID: Barry Vazquez, male   DOB: Dec 20, 1950, 65 y.o.   MRN: [id number]  HPI Patient seen for complete physical. His chronic problems include history of hypertension, prediabetes, hypothyroidism, hyperlipidemia, gout, detached right retina. He is due for shingles vaccine and tetanus. Other immunizations up-to-date. Medications reviewed. Compliant with all. No recent gout flareups. Colonoscopy up-to-date.  Ongoing bilateral knee pain. Followed by orthopedics. He is anticipating need for bilateral knee replacements in the next few years.  Past Medical History:  Diagnosis Date  . Arthritis   . Cataract   . EYE FLOATERS 02/20/2009  . Gout, unspecified 02/20/2009  . HYPERLIPIDEMIA 08/26/2008  . HYPERTENSION 08/26/2008  . HYPOTHYROIDISM 08/26/2008  . SHOULDER PAIN 09/11/2009  . SYNCOPE 04/14/2010  . Unspecified visual disturbance 10/14/2009   Past Surgical History:  Procedure Laterality Date  . CARPAL TUNNEL RELEASE     left wrist  . EYE SURGERY    . KNEE ARTHROSCOPY     left X 2  . VASECTOMY      reports that he has never smoked. He has never used smokeless tobacco. He reports that he drinks alcohol. He reports that he does not use drugs. family history includes Alzheimer's disease in his father; Crohn's disease in his father; Depression in his other; Hyperlipidemia in his mother; Hypertension in his mother; Stroke in his mother. No Known Allergies   Review of Systems  Constitutional: Negative for activity change, appetite change, fatigue and fever.  HENT: Negative for congestion, ear pain and trouble swallowing.   Eyes: Negative for pain and visual disturbance.  Respiratory: Negative for cough, shortness of breath and wheezing.   Cardiovascular: Negative for chest pain and palpitations.  Gastrointestinal: Negative for abdominal distention, abdominal pain, blood in stool, constipation, diarrhea, nausea, rectal pain and vomiting.  Genitourinary: Negative for  dysuria, hematuria and testicular pain.  Musculoskeletal: Positive for arthralgias. Negative for joint swelling.  Skin: Negative for rash.  Neurological: Negative for dizziness, syncope and headaches.  Hematological: Negative for adenopathy.  Psychiatric/Behavioral: Negative for confusion and dysphoric mood.       Objective:   Physical Exam  Constitutional: He is oriented to person, place, and time. He appears well-developed and well-nourished. No distress.  HENT:  Head: Normocephalic and atraumatic.  Right Ear: External ear normal.  Left Ear: External ear normal.  Mouth/Throat: Oropharynx is clear and moist.  Eyes: Conjunctivae and EOM are normal. Pupils are equal, round, and reactive to light.  Neck: Normal range of motion. Neck supple. No thyromegaly present.  Cardiovascular: Normal rate, regular rhythm and normal heart sounds.   No murmur heard. Pulmonary/Chest: No respiratory distress. He has no wheezes. He has no rales.  Abdominal: Soft. Bowel sounds are normal. He exhibits no distension and no mass. There is no tenderness. There is no rebound and no guarding.  Musculoskeletal: He exhibits no edema.  Just some mild crepitus with flexion and extension of both knees. No knee effusion  Lymphadenopathy:    He has no cervical adenopathy.  Neurological: He is alert and oriented to person, place, and time. He displays normal reflexes. No cranial nerve deficit.  Skin: No rash noted.  Psychiatric: He has a normal mood and affect.       Assessment:     Physical exam. Patient needs tetanus booster. Also no history of shingles vaccine.    Plan:     -Labs reviewed. He has mild hyperglycemia and we recommended weight loss along with reduction in  sugars and white starches -Tetanus booster given -Check on coverage for shingles vaccine and let us know if interested -We'll need repeat colonoscopy in about 2 years  [name] MD [facility] at [facility]  -

## 2016-01-07 ENCOUNTER — Other Ambulatory Visit: Payer: Self-pay

## 2016-01-09 ENCOUNTER — Other Ambulatory Visit: Payer: Self-pay

## 2016-01-09 MED ORDER — ATORVASTATIN CALCIUM 40 MG PO TABS: ORAL_TABLET | ORAL | 2 refills | Status: DC

## 2016-01-09 MED ORDER — LEVOTHYROXINE SODIUM 112 MCG PO TABS: ORAL_TABLET | ORAL | 2 refills | Status: DC

## 2016-01-09 MED ORDER — ALLOPURINOL 300 MG PO TABS: ORAL_TABLET | ORAL | 2 refills | Status: DC

## 2016-01-09 MED ORDER — AMLODIPINE BESY-BENAZEPRIL HCL 5-20 MG PO CAPS: 1.0000 | ORAL_CAPSULE | Freq: Every day | ORAL | 2 refills | Status: DC

## 2016-02-10 ENCOUNTER — Encounter: Payer: Self-pay | Admitting: Gastroenterology

## 2016-03-30 ENCOUNTER — Telehealth: Payer: Self-pay | Admitting: Family Medicine

## 2016-03-30 NOTE — Telephone Encounter (Signed)
Please advise 

## 2016-03-30 NOTE — Telephone Encounter (Signed)
Pt states his pharmacy is out/backordered of the amLODipine-benazepril (LOTREL) 5-20 MG capsule  Pt needs another alternate drug at least for a month to see if this will be resolved by the manufacturer. Pt is out and needs asap. (went to pick up and they just told him)  [facility] [place]

## 2016-03-30 NOTE — Telephone Encounter (Signed)
I would "split" these out and prescribe Amlodipine 5 mg po once daily and Benazepril 20 mg once daily.

## 2016-03-31 MED ORDER — AMLODIPINE BESYLATE 5 MG PO TABS: 5.0000 mg | ORAL_TABLET | Freq: Every day | ORAL | 3 refills | Status: DC

## 2016-03-31 MED ORDER — BENAZEPRIL HCL 20 MG PO TABS: 20.0000 mg | ORAL_TABLET | Freq: Every day | ORAL | 3 refills | Status: DC

## 2016-03-31 NOTE — Telephone Encounter (Signed)
Pt is aware via voicemail that medication was sent to the pharmacy.

## 2016-04-15 LAB — HM DIABETES EYE EXAM

## 2016-04-20 ENCOUNTER — Encounter: Payer: Self-pay | Admitting: Family Medicine

## 2016-04-21 ENCOUNTER — Encounter (INDEPENDENT_AMBULATORY_CARE_PROVIDER_SITE_OTHER): Payer: Self-pay | Admitting: Ophthalmology

## 2016-08-02 ENCOUNTER — Ambulatory Visit (INDEPENDENT_AMBULATORY_CARE_PROVIDER_SITE_OTHER): Payer: 59 | Admitting: Family Medicine

## 2016-08-02 ENCOUNTER — Encounter: Payer: Self-pay | Admitting: Family Medicine

## 2016-08-02 VITALS — BP 120/80 | HR 71 | Temp 98.5°F | Wt 258.1 lb

## 2016-08-02 DIAGNOSIS — L0231 Cutaneous abscess of buttock: Secondary | ICD-10-CM

## 2016-08-02 MED ORDER — CEPHALEXIN 500 MG PO CAPS: 500.0000 mg | ORAL_CAPSULE | Freq: Four times a day (QID) | ORAL | 0 refills | Status: DC

## 2016-08-02 NOTE — Progress Notes (Signed)
Subjective:     Patient ID: Barry Vazquez, male   DOB: [birth year], 66 y.o.   MRN: [id number]  HPI Patient seen with swollen sore "spot" left mid buttock with onset yesterday. By today, had increased area of swelling. No fevers or chills. No history of MRSA. Denies prior history of abscess. Did not try any warm soaks. No drainage.  Past Medical History:  Diagnosis Date  . Arthritis   . Cataract   . EYE FLOATERS 02/20/2009  . Gout, unspecified 02/20/2009  . HYPERLIPIDEMIA 08/26/2008  . HYPERTENSION 08/26/2008  . HYPOTHYROIDISM 08/26/2008  . SHOULDER PAIN 09/11/2009  . SYNCOPE 04/14/2010  . Unspecified visual disturbance 10/14/2009   Past Surgical History:  Procedure Laterality Date  . CARPAL TUNNEL RELEASE     left wrist  . EYE SURGERY    . KNEE ARTHROSCOPY     left X 2  . VASECTOMY      reports that he has never smoked. He has never used smokeless tobacco. He reports that he drinks alcohol. He reports that he does not use drugs. family history includes Alzheimer's disease in his father; Crohn's disease in his father; Depression in his other; Hyperlipidemia in his mother; Hypertension in his mother; Stroke in his mother. No Known Allergies   Review of Systems  Constitutional: Negative for chills and fever.       Objective:   Physical Exam  Constitutional: He appears well-developed and well-nourished.  Cardiovascular: Normal rate and regular rhythm.   Skin:  Patient has area of erythema approximately 3-4 cm left buttock near the midline. Slightly sore to touch. Mostly indurated-near the center in area approximately 1 x 2 cm. No pustules       Assessment:     Early cellulitis/abscess left buttock    Plan:     -We discussed risk and benefits of incision and drainage. We explained that sometimes with induration we cannot get out much drainage but he has had progressive symptoms and is requesting incision. -Skin prepped with Betadine. Local anesthesia with 2% plain  Xylocaine. Using #11 blade made a 1 cm linear incision over the central area of erythema. Minimal purulence. Used curved hemostats and explored below the surface for any deeper pockets and none obtained. Packing with quarter inch iodoform gauze. Minimal bleeding. Outer Telfa dressing applied -Keep dressing dry. Return tomorrow for reassessment -Start Keflex 500 mg 4 times a day for 7 days  [name] MD [facility]

## 2016-08-02 NOTE — Progress Notes (Signed)
Pre visit review using our clinic review tool, if applicable. No additional management support is needed unless otherwise documented below in the visit note. 

## 2016-08-02 NOTE — Patient Instructions (Signed)
Keep wound dry tonight. Return tomorrow for packing removal. Start the Keflex tonight.

## 2016-08-03 ENCOUNTER — Ambulatory Visit (INDEPENDENT_AMBULATORY_CARE_PROVIDER_SITE_OTHER): Payer: 59 | Admitting: Family Medicine

## 2016-08-03 ENCOUNTER — Encounter: Payer: Self-pay | Admitting: Family Medicine

## 2016-08-03 VITALS — BP 140/80 | HR 68 | Temp 98.3°F | Wt 260.2 lb

## 2016-08-03 DIAGNOSIS — L0231 Cutaneous abscess of buttock: Secondary | ICD-10-CM

## 2016-08-03 NOTE — Patient Instructions (Signed)
Warm tub soaks for 15-20 minutes 2 times daily for the next few days Follow up for any increased redness, swelling, or pain.

## 2016-08-03 NOTE — Progress Notes (Signed)
Pre visit review using our clinic review tool, if applicable. No additional management support is needed unless otherwise documented below in the visit note. 

## 2016-08-03 NOTE — Progress Notes (Signed)
Subjective:     Patient ID: Barry Vazquez, male   DOB: 11-10-1950, 66 y.o.   MRN: [id number]  HPI  Patient seen for follow-up regarding left buttock abscess. Incision and drainage yesterday. This was mostly indurated and minimal purulence expressed. We started him on Keflex. He is here today for packing removal and reassessment. Still has some soreness. No fevers or chills. He had some bloody drainage but otherwise no drainage  Past Medical History:  Diagnosis Date  . Arthritis   . Cataract   . EYE FLOATERS 02/20/2009  . Gout, unspecified 02/20/2009  . HYPERLIPIDEMIA 08/26/2008  . HYPERTENSION 08/26/2008  . HYPOTHYROIDISM 08/26/2008  . SHOULDER PAIN 09/11/2009  . SYNCOPE 04/14/2010  . Unspecified visual disturbance 10/14/2009   Past Surgical History:  Procedure Laterality Date  . CARPAL TUNNEL RELEASE     left wrist  . EYE SURGERY    . KNEE ARTHROSCOPY     left X 2  . VASECTOMY      reports that he has never smoked. He has never used smokeless tobacco. He reports that he drinks alcohol. He reports that he does not use drugs. family history includes Alzheimer's disease in his father; Crohn's disease in his father; Depression in his other; Hyperlipidemia in his mother; Hypertension in his mother; Stroke in his mother. No Known Allergies  Review of Systems  Constitutional: Negative for chills and fever.       Objective:   Physical Exam  Constitutional: He appears well-developed and well-nourished.  Cardiovascular: Normal rate and regular rhythm.   Skin:  Left buttock-packing removed. No purulent drainage. Still has mild erythema and induration but no fluctuance. Minimally tender       Assessment:     Abscess left buttock with I&D yesterday with minimal purulent drainage    Plan:     -Start warm sitz baths twice daily for the next couple days -Continue Keflex 500 milligrams 4 times a day -Follow-up promptly for any increased erythema, swelling, or pain  [name] MD [facility]

## 2016-10-09 ENCOUNTER — Other Ambulatory Visit: Payer: Self-pay | Admitting: Family Medicine

## 2016-10-12 ENCOUNTER — Other Ambulatory Visit: Payer: Self-pay | Admitting: Family Medicine

## 2016-10-24 ENCOUNTER — Other Ambulatory Visit: Payer: Self-pay | Admitting: Family Medicine

## 2016-12-29 ENCOUNTER — Telehealth: Payer: Self-pay | Admitting: *Deleted

## 2016-12-29 NOTE — Telephone Encounter (Signed)
May switch to another generic equivalent at same dose.

## 2016-12-29 NOTE — Telephone Encounter (Signed)
[facility] [place] lev[facility]roxine (SYNTHROID, LEVOTHROID) 112 MCG tablet Drug made by [facility] is on backorder until December.  Do you want to change the dose or brand?

## 2016-12-30 NOTE — Telephone Encounter (Signed)
[facility] and spoke [facility] Kidney and informed her of the message below and she stated this will be changed to [facility] version.

## 2017-01-02 ENCOUNTER — Other Ambulatory Visit: Payer: Self-pay | Admitting: Family Medicine

## 2017-01-25 ENCOUNTER — Other Ambulatory Visit: Payer: Self-pay | Admitting: Family Medicine

## 2017-03-05 ENCOUNTER — Other Ambulatory Visit: Payer: Self-pay | Admitting: Family Medicine

## 2017-03-07 NOTE — Telephone Encounter (Signed)
One Rx sent for a 30 day supply with no refills. Pt is due for an OV and to have there TSH retested last tested on 12/2015.

## 2017-03-23 ENCOUNTER — Other Ambulatory Visit: Payer: Self-pay | Admitting: Family Medicine

## 2017-04-13 ENCOUNTER — Other Ambulatory Visit: Payer: Self-pay | Admitting: Family Medicine

## 2017-04-23 ENCOUNTER — Other Ambulatory Visit: Payer: Self-pay | Admitting: Family Medicine

## 2017-05-30 ENCOUNTER — Other Ambulatory Visit: Payer: Self-pay | Admitting: Family Medicine

## 2017-07-08 ENCOUNTER — Other Ambulatory Visit: Payer: Self-pay | Admitting: Family Medicine

## 2017-08-07 ENCOUNTER — Other Ambulatory Visit: Payer: Self-pay | Admitting: Family Medicine

## 2017-08-15 ENCOUNTER — Other Ambulatory Visit: Payer: Self-pay | Admitting: Family Medicine

## 2017-08-15 ENCOUNTER — Telehealth: Payer: Self-pay | Admitting: Family Medicine

## 2017-08-15 NOTE — Telephone Encounter (Signed)
Copied from [place] [id number]. Topic: Quick Communication - Rx Refill/Question >> Aug 15, 2017  3:32 PM Oliver Pila B wrote: Medication: atorvastatin (LIPITOR) 40 MG tablet [[id number]]  Has the patient contacted their pharmacy? Yes.   (Agent: If no, request that the patient contact the pharmacy for the refill.) [facility]erred [facility] (with [facility] [place] Agent: Please be advised that RX refills may take up to 3 business days. We ask that you follow-up with your pharmacy.  Pt is needing some pills to hold him over until his appt on 4.22.19

## 2017-08-16 NOTE — Telephone Encounter (Signed)
Med refill and sent to correct pharmacy on 08/15/17

## 2017-08-22 ENCOUNTER — Ambulatory Visit: Payer: 59 | Admitting: Family Medicine

## 2017-08-22 ENCOUNTER — Other Ambulatory Visit: Payer: Self-pay

## 2017-08-22 ENCOUNTER — Encounter: Payer: Self-pay | Admitting: Family Medicine

## 2017-08-22 VITALS — BP 132/80 | HR 74 | Temp 98.0°F | Resp 16 | Ht 73.0 in | Wt 258.0 lb

## 2017-08-22 DIAGNOSIS — R739 Hyperglycemia, unspecified: Secondary | ICD-10-CM

## 2017-08-22 DIAGNOSIS — E039 Hypothyroidism, unspecified: Secondary | ICD-10-CM | POA: Diagnosis not present

## 2017-08-22 DIAGNOSIS — I10 Essential (primary) hypertension: Secondary | ICD-10-CM

## 2017-08-22 DIAGNOSIS — E785 Hyperlipidemia, unspecified: Secondary | ICD-10-CM

## 2017-08-22 LAB — BASIC METABOLIC PANEL
BUN: 18 mg/dL (ref 6–23)
CALCIUM: 9.3 mg/dL (ref 8.4–10.5)
CO2: 28 meq/L (ref 19–32)
CREATININE: 0.85 mg/dL (ref 0.40–1.50)
Chloride: 101 mEq/L (ref 96–112)
GFR: 96.13 mL/min (ref >60.00)
GLUCOSE: 111 mg/dL — AB (ref 70–99)
Potassium: 4.5 mEq/L (ref 3.5–5.1)
Sodium: 140 mEq/L (ref 135–145)

## 2017-08-22 LAB — HEPATIC FUNCTION PANEL
ALK PHOS: 57 U/L (ref 39–117)
ALT: 31 U/L (ref 0–53)
AST: 21 U/L (ref 0–37)
Albumin: 4.3 g/dL (ref 3.5–5.2)
BILIRUBIN DIRECT: 0.2 mg/dL (ref 0.0–0.3)
BILIRUBIN TOTAL: 1 mg/dL (ref 0.2–1.2)
Total Protein: 6.4 g/dL (ref 6.0–8.3)

## 2017-08-22 LAB — LIPID PANEL
CHOL/HDL RATIO: 4
Cholesterol: 177 mg/dL (ref 0–200)
HDL: 43.7 mg/dL (ref >39.00)
LDL Cholesterol: 105 mg/dL — ABNORMAL HIGH (ref 0–99)
NONHDL: 132.95
Triglycerides: 139 mg/dL (ref 0.0–149.0)
VLDL: 27.8 mg/dL (ref 0.0–40.0)

## 2017-08-22 LAB — TSH: TSH: 1.02 u[IU]/mL (ref 0.35–4.50)

## 2017-08-22 LAB — HEMOGLOBIN A1C: Hgb A1c MFr Bld: 6.6 % — ABNORMAL HIGH (ref 4.6–6.5)

## 2017-08-22 NOTE — Progress Notes (Signed)
Subjective:     Patient ID: Barry Vazquez, male   DOB: 04/17/1951, 67 y.o.   MRN: [id number]  HPI Patient here for medical follow-up. He just sold his house here and bought a new house which is currently being built in [place] (at [employer]). He is in a slow down phase with his work and plans to be fully retired by the end of the year.  Chronic problems include history of hypothyroidism, hypertension, hyperlipidemia, gout, and hyperglycemia with prediabetes range blood sugars in the past. He did receive cortisone shots in both knees about a month ago which could skew his blood sugar readings somewhat  No recent gout flares. No polyuria or polydipsia. No headaches or dizziness. No chest pains. Compliant with medications. Overdue for labs.  Past Medical History:  Diagnosis Date  . Arthritis   . Cataract   . EYE FLOATERS 02/20/2009  . Gout, unspecified 02/20/2009  . HYPERLIPIDEMIA 08/26/2008  . HYPERTENSION 08/26/2008  . HYPOTHYROIDISM 08/26/2008  . SHOULDER PAIN 09/11/2009  . SYNCOPE 04/14/2010  . Unspecified visual disturbance 10/14/2009   Past Surgical History:  Procedure Laterality Date  . CARPAL TUNNEL RELEASE     left wrist  . EYE SURGERY    . KNEE ARTHROSCOPY     left X 2  . VASECTOMY      reports that he has never smoked. He has never used smokeless tobacco. He reports that he drinks alcohol. He reports that he does not use drugs. family history includes Alzheimer's disease in his father; Crohn's disease in his father; Depression in his other; Hyperlipidemia in his mother; Hypertension in his mother; Stroke in his mother. No Known Allergies   Review of Systems  Constitutional: Negative for fatigue.  Eyes: Negative for visual disturbance.  Respiratory: Negative for cough, chest tightness and shortness of breath.   Cardiovascular: Negative for chest pain, palpitations and leg swelling.  Musculoskeletal: Positive for arthralgias.  Neurological: Negative for  dizziness, syncope, weakness, light-headedness and headaches.       Objective:   Physical Exam  Constitutional: He appears well-developed and well-nourished.  Neck: Neck supple. No thyromegaly present.  Cardiovascular: Normal rate and regular rhythm.  Pulmonary/Chest: Effort normal and breath sounds normal. No respiratory distress. He has no wheezes. He has no rales.  Musculoskeletal: He exhibits no edema.  Lymphadenopathy:    He has no cervical adenopathy.       Assessment:     #1 hypertension stable and at goal  #2 hyperlipidemia  #3 hypothyroidism  #4 gout currently stable on allopurinol  #5 history of hyperglycemia    Plan:     -Check labs today with basic metabolic panel, lipid panel, hepatic panel, A1c -Hopefully can establish more consistent exercise- especially with retirement coming up -He'll be establishing with new primary care once he settles in [place] [facility]

## 2017-08-31 ENCOUNTER — Other Ambulatory Visit: Payer: Self-pay | Admitting: Family Medicine

## 2017-09-19 ENCOUNTER — Other Ambulatory Visit: Payer: Self-pay | Admitting: Family Medicine

## 2017-10-02 ENCOUNTER — Other Ambulatory Visit: Payer: Self-pay | Admitting: Family Medicine

## 2017-11-07 ENCOUNTER — Other Ambulatory Visit: Payer: Self-pay | Admitting: Family Medicine

## 2017-12-27 ENCOUNTER — Ambulatory Visit: Payer: Self-pay | Admitting: Family Medicine

## 2017-12-27 NOTE — Telephone Encounter (Signed)
We will discuss whether to get labs at follow up.

## 2017-12-27 NOTE — Telephone Encounter (Signed)
Pt has been urinating a lot in last 5 month. Pt is living in [place] now. Please advise     Patient is calling to discuss the nocturia he has been experiencing. Patient states he gets up more frequently at night with increased urgency. Patient states he will be in town 9/27 and he request an appointment for that day. Patient states if PCP would like any pre testing PSA/urine culture- he can find a local [facility] to go to- he just needs an order.  Reason for Disposition . Has to get out of bed to urinate > 2 times a night (i.e., nocturia)  Answer Assessment - Initial Assessment Questions 1. SYMPTOM: "What's the main symptom you're concerned about?" (e.g., frequency, incontinence)     Frequency at night 2. ONSET: "When did the  frequency  start?"     Urgency to go is worse 3. PAIN: "Is there any pain?" If so, ask: "How bad is it?" (Scale: 1-10; mild, moderate, severe)     no 4. CAUSE: "What do you think is causing the symptoms?"     Possible prostate isue 5. OTHER SYMPTOMS: "Do you have any other symptoms?" (e.g., fever, flank pain, blood in urine, pain with urination)     no 6. PREGNANCY: "Is there any chance you are pregnant?" "When was your last menstrual period?"     n/a  Protocols used: URINARY [facility]

## 2017-12-28 NOTE — Telephone Encounter (Signed)
Patient is aware 

## 2018-01-27 ENCOUNTER — Ambulatory Visit (INDEPENDENT_AMBULATORY_CARE_PROVIDER_SITE_OTHER): Payer: 59 | Admitting: Family Medicine

## 2018-01-27 ENCOUNTER — Other Ambulatory Visit: Payer: Self-pay

## 2018-01-27 ENCOUNTER — Encounter: Payer: Self-pay | Admitting: Family Medicine

## 2018-01-27 VITALS — BP 126/74 | HR 76 | Temp 98.0°F | Ht 73.0 in | Wt 257.8 lb

## 2018-01-27 DIAGNOSIS — R351 Nocturia: Secondary | ICD-10-CM | POA: Diagnosis not present

## 2018-01-27 DIAGNOSIS — N401 Enlarged prostate with lower urinary tract symptoms: Secondary | ICD-10-CM

## 2018-01-27 DIAGNOSIS — R739 Hyperglycemia, unspecified: Secondary | ICD-10-CM | POA: Diagnosis not present

## 2018-01-27 LAB — PSA: PSA: 1.77 ng/mL (ref 0.10–4.00)

## 2018-01-27 LAB — HEMOGLOBIN A1C: HEMOGLOBIN A1C: 6.6 % — AB (ref 4.6–6.5)

## 2018-01-27 MED ORDER — TAMSULOSIN HCL 0.4 MG PO CAPS: 0.4000 mg | ORAL_CAPSULE | Freq: Every day | ORAL | 3 refills | Status: DC

## 2018-01-27 NOTE — Progress Notes (Signed)
  Subjective:     Patient ID: Barry Vazquez, male   DOB: November 23, 1950, 67 y.o.   MRN: [id number]  HPI Is seen with chief complaint of nocturia and increased urine frequency especially at night.  He states on a good night he gets up about twice per night and on a bad night about 6 times.  He has noticed that he is very sensitive things like caffeine which he is trying to avoid.  He has in addition to frequency noticed some issues with slow stream and occasional dribbling stream at night.  He is not sure he is emptying bladder completely.  He does have some sense of urgency.  He had PSA 1.62 about 2 years ago.  No follow-up since then.  No recent burning with urination.  Patient does have hyperglycemia.  With labs several months ago A1c 6.6%.  He has tried to scale back some calories.  Weight is stable.  No thirst.  Not monitoring blood sugars.  Past Medical History:  Diagnosis Date  . Arthritis   . Cataract   . EYE FLOATERS 02/20/2009  . Gout, unspecified 02/20/2009  . HYPERLIPIDEMIA 08/26/2008  . HYPERTENSION 08/26/2008  . HYPOTHYROIDISM 08/26/2008  . SHOULDER PAIN 09/11/2009  . SYNCOPE 04/14/2010  . Unspecified visual disturbance 10/14/2009   Past Surgical History:  Procedure Laterality Date  . CARPAL TUNNEL RELEASE     left wrist  . EYE SURGERY    . KNEE ARTHROSCOPY     left X 2  . VASECTOMY      reports that he has never smoked. He has never used smokeless tobacco. He reports that he drinks alcohol. He reports that he does not use drugs. family history includes Alzheimer's disease in his father; Crohn's disease in his father; Depression in his other; Hyperlipidemia in his mother; Hypertension in his mother; Stroke in his mother. No Known Allergies   Review of Systems  Constitutional: Negative for chills and fever.  Respiratory: Negative for shortness of breath.   Cardiovascular: Negative for chest pain.  Endocrine: Negative for polydipsia.  Genitourinary: Positive for difficulty  urinating and urgency. Negative for dysuria, flank pain and hematuria.  Musculoskeletal: Negative for back pain.       Objective:   Physical Exam  Constitutional: He appears well-developed and well-nourished.  Cardiovascular: Normal rate and regular rhythm.  Pulmonary/Chest: Effort normal and breath sounds normal.  Genitourinary:  Genitourinary Comments: Prostate is diffusely enlarged.  No nodules.  Nontender.  Musculoskeletal: He exhibits no edema.       Assessment:     #1 increased nocturia.  He does describe some slow stream and suspect secondary to BPH.  He may have some component of urinary urgency as well.  #2 history of hyperglycemia.  Last A1c 6.6%.    Plan:     -Recheck PSA and hemoglobin A1c -Avoid caffeine after about 1 to 2 PM -Consider trial of Flomax 0.4 mg nightly -Continue to restrict sugars and white starches  [name] MD [facility]

## 2018-01-27 NOTE — Patient Instructions (Signed)
Benign Prostatic Hyperplasia  Benign prostatic hyperplasia (BPH) is an enlarged prostate gland that is caused by the normal aging process and not by cancer. The prostate is a walnut-sized gland that is involved in the production of semen. It is located in front of the rectum and below the bladder. The bladder stores urine and the urethra is the tube that carries the urine out of the body. The prostate may get bigger as a man gets older.  An enlarged prostate can press on the urethra. This can make it harder to pass urine. The build-up of urine in the bladder can cause infection. Back pressure and infection may progress to bladder damage and kidney (renal) failure.  What are the causes?  This condition is part of a normal aging process. However, not all men develop problems from this condition. If the prostate enlarges away from the urethra, urine flow will not be blocked. If it enlarges toward the urethra and compresses it, there will be problems passing urine.  What increases the risk?  This condition is more likely to develop in men over the age of 50 years.  What are the signs or symptoms?  Symptoms of this condition include:  · Getting up often during the night to urinate.  · Needing to urinate frequently during the day.  · Difficulty starting urine flow.  · Decrease in size and strength of your urine stream.  · Leaking (dribbling) after urinating.  · Inability to pass urine. This needs immediate treatment.  · Inability to completely empty your bladder.  · Pain when you pass urine. This is more common if there is also an infection.  · Urinary tract infection (UTI).    How is this diagnosed?  This condition is diagnosed based on your medical history, a physical exam, and your symptoms. Tests will also be done, such as:  · A post-void bladder scan. This measures any amount of urine that may remain in your bladder after you finish urinating.  · A digital rectal exam. In a rectal exam, your health care provider  checks your prostate by putting a lubricated, gloved finger into your rectum to feel the back of your prostate gland. This exam detects the size of your gland and any abnormal lumps or growths.  · An exam of your urine (urinalysis).  · A prostate specific antigen (PSA) screening. This is a blood test used to screen for prostate cancer.  · An ultrasound. This test uses sound waves to electronically produce a picture of your prostate gland.    Your health care provider may refer you to a specialist in kidney and prostate diseases (urologist).  How is this treated?  Once symptoms begin, your health care provider will monitor your condition (active surveillance or watchful waiting). Treatment for this condition will depend on the severity of your condition. Treatment may include:  · Observation and yearly exams. This may be the only treatment needed if your condition and symptoms are mild.  · Medicines to relieve your symptoms, including:  ? Medicines to shrink the prostate.  ? Medicines to relax the muscle of the prostate.  · Surgery in severe cases. Surgery may include:  ? Prostatectomy. In this procedure, the prostate tissue is removed completely through an open incision or with a laparascope or robotics.  ? Transurethral resection of the prostate (TURP). In this procedure, a tool is inserted through the opening at the tip of the penis (urethra). It is used to cut away tissue of   the inner core of the prostate. The pieces are removed through the same opening of the penis. This removes the blockage.  ? Transurethral incision (TUIP). In this procedure, small cuts are made in the prostate. This lessens the prostate's pressure on the urethra.  ? Transurethral microwave thermotherapy (TUMT). This procedure uses microwaves to create heat. The heat destroys and removes a small amount of prostate tissue.  ? Transurethral needle ablation (TUNA). This procedure uses radio frequencies to destroy and remove a small amount of  prostate tissue.  ? Interstitial laser coagulation (ILC). This procedure uses a laser to destroy and remove a small amount of prostate tissue.  ? Transurethral electrovaporization (TUVP). This procedure uses electrodes to destroy and remove a small amount of prostate tissue.  ? Prostatic urethral lift. This procedure inserts an implant to push the lobes of the prostate away from the urethra.    Follow these instructions at home:  · Take over-the-counter and prescription medicines only as told by your health care provider.  · Monitor your symptoms for any changes. Contact your health care provider with any changes.  · Avoid drinking large amounts of liquid before going to bed or out in public.  · Avoid or reduce how much caffeine or alcohol you drink.  · Give yourself time when you urinate.  · Keep all follow-up visits as told by your health care provider. This is important.  Contact a health care provider if:  · You have unexplained back pain.  · Your symptoms do not get better with treatment.  · You develop side effects from the medicine you are taking.  · Your urine becomes very dark or has a bad smell.  · Your lower abdomen becomes distended and you have trouble passing your urine.  Get help right away if:  · You have a fever or chills.  · You suddenly cannot urinate.  · You feel lightheaded, or very dizzy, or you faint.  · There are large amounts of blood or clots in the urine.  · Your urinary problems become hard to manage.  · You develop moderate to severe low back or flank pain. The flank is the side of your body between the ribs and the hip.  These symptoms may represent a serious problem that is an emergency. Do not wait to see if the symptoms will go away. Get medical help right away. Call your local emergency services (911 in the [place] Do not drive yourself to the hospital.  Summary  · Benign prostatic hyperplasia (BPH) is an enlarged prostate that is caused by the normal aging process and not by  cancer.  · An enlarged prostate can press on the urethra. This can make it hard to pass urine.  · This condition is part of a normal aging process and is more likely to develop in men over the age of 50 years.  · Get help right away if you suddenly cannot urinate.  This information is not intended to replace advice given to you by your health care provider. Make sure you discuss any questions you have with your health care provider.  Document Released: 04/19/2005 Document Revised: 05/24/2016 Document Reviewed: 05/24/2016  Elsevier Interactive Patient Education © 2018 [employer] Inc[employer]

## 2018-02-16 ENCOUNTER — Other Ambulatory Visit: Payer: Self-pay | Admitting: Family Medicine

## 2018-04-09 ENCOUNTER — Other Ambulatory Visit: Payer: Self-pay | Admitting: Family Medicine

## 2018-06-01 ENCOUNTER — Other Ambulatory Visit: Payer: Self-pay | Admitting: Family Medicine

## 2018-06-30 ENCOUNTER — Other Ambulatory Visit: Payer: Self-pay | Admitting: Family Medicine

## 2018-07-03 ENCOUNTER — Other Ambulatory Visit: Payer: Self-pay | Admitting: Family Medicine

## 2018-09-03 ENCOUNTER — Other Ambulatory Visit: Payer: Self-pay | Admitting: Family Medicine

## 2018-09-04 NOTE — Telephone Encounter (Signed)
Patient has an appointment on 09/06/18

## 2018-09-06 ENCOUNTER — Ambulatory Visit (INDEPENDENT_AMBULATORY_CARE_PROVIDER_SITE_OTHER): Payer: Medicare PPO | Admitting: Family Medicine

## 2018-09-06 ENCOUNTER — Other Ambulatory Visit: Payer: Self-pay

## 2018-09-06 DIAGNOSIS — E039 Hypothyroidism, unspecified: Secondary | ICD-10-CM | POA: Diagnosis not present

## 2018-09-06 DIAGNOSIS — I10 Essential (primary) hypertension: Secondary | ICD-10-CM | POA: Diagnosis not present

## 2018-09-06 DIAGNOSIS — E785 Hyperlipidemia, unspecified: Secondary | ICD-10-CM

## 2018-09-06 DIAGNOSIS — N400 Enlarged prostate without lower urinary tract symptoms: Secondary | ICD-10-CM

## 2018-09-06 DIAGNOSIS — R739 Hyperglycemia, unspecified: Secondary | ICD-10-CM

## 2018-09-06 MED ORDER — LEVOTHYROXINE SODIUM 112 MCG PO TABS: ORAL_TABLET | ORAL | 3 refills | Status: DC

## 2018-09-06 NOTE — Progress Notes (Signed)
Patient ID: Barry Vazquez, male   DOB: 1951-01-28, 68 y.o.   MRN: [id number]  This visit type was conducted due to national recommendations for restrictions regarding the COVID-19 pandemic in an effort to limit this patient's exposure and mitigate transmission in our community.   Virtual Visit via Video Note  I connected with Zorita Pang on 09/06/18 at  9:00 AM EDT by a video enabled telemedicine application and verified that I am speaking with the correct person using two identifiers.  Location patient: home Location provider:work or home office Persons participating in the virtual visit: patient, provider  I discussed the limitations of evaluation and management by telemedicine and the availability of in person appointments. The patient expressed understanding and agreed to proceed.   HPI: Follow-up regarding multiple medical problems including hypertension, hypothyroidism, hyperlipidemia, hyperglycemia, BPH.  Last fall he had significant nocturia and slow stream.  PSA was stable at 1.77.  We started tamsulosin 0.4 mg at night and symptoms are greatly improved.  He has good flow and much less nocturia.  When he avoids caffeine at night sometimes sleeps through the night or sometimes gets up maybe once.  Overall very pleased.  Has been exercising more recently.  Weight stable.  History of hyperglycemia.  Last A1c 6.6%.  Not monitoring regularly.  No polyuria or polydipsia.  He has gout and is on allopurinol.  No recent gout flareups.  Compliant with all.  He has hypothyroidism and is on replacement.  He is overdue for several labs.  He moved this past year to [facility] and is there most the time.  He will be back here early next week   ROS: See pertinent positives and negatives per HPI.  Past Medical History:  Diagnosis Date  . Arthritis   . Cataract   . EYE FLOATERS 02/20/2009  . Gout, unspecified 02/20/2009  . HYPERLIPIDEMIA 08/26/2008  . HYPERTENSION 08/26/2008  .  HYPOTHYROIDISM 08/26/2008  . SHOULDER PAIN 09/11/2009  . SYNCOPE 04/14/2010  . Unspecified visual disturbance 10/14/2009    Past Surgical History:  Procedure Laterality Date  . CARPAL TUNNEL RELEASE     left wrist  . EYE SURGERY    . KNEE ARTHROSCOPY     left X 2  . VASECTOMY      Family History  Problem Relation Age of Onset  . Depression Other   . Hyperlipidemia Mother   . Stroke Mother   . Hypertension Mother   . Crohn's disease Father   . Alzheimer's disease Father     SOCIAL HX: Non-smoker.  He is now retired.  Lives at [facility]   Current Outpatient Medications:  .  allopurinol (ZYLOPRIM) 300 MG tablet, TAKE 1 TABLET BY MOUTH EVERY DAY, Disp: 90 tablet, Rfl: 1 .  amLODipine (NORVASC) 5 MG tablet, TAKE 1 TABLET BY MOUTH DAILY., Disp: 90 tablet, Rfl: 0 .  atorvastatin (LIPITOR) 40 MG tablet, TAKE 1 TABLET BY MOUTH EVERY DAY, Disp: 90 tablet, Rfl: 1 .  benazepril (LOTENSIN) 20 MG tablet, TAKE 1 TABLET BY MOUTH DAILY., Disp: 90 tablet, Rfl: 2 .  ibuprofen (ADVIL,MOTRIN) 200 MG tablet, ibuprofen  prn, Disp: , Rfl:  .  levothyroxine (SYNTHROID) 112 MCG tablet, TAKE 1 TABLET (112 MCG TOTAL) BY MOUTH DAILY., Disp: 90 tablet, Rfl: 3 .  tamsulosin (FLOMAX) 0.4 MG CAPS capsule, TAKE ONE CAPSULE EVERY DAY, Disp: 90 capsule, Rfl: 1  EXAM:  VITALS per patient if applicable:  GENERAL: alert, oriented, appears well and in no acute  distress  HEENT: atraumatic, conjunttiva clear, no obvious abnormalities on inspection of external nose and ears  NECK: normal movements of the head and neck  LUNGS: on inspection no signs of respiratory distress, breathing rate appears normal, no obvious gross SOB, gasping or wheezing  CV: no obvious cyanosis  MS: moves all visible extremities without noticeable abnormality  PSYCH/NEURO: pleasant and cooperative, no obvious depression or anxiety, speech and thought processing grossly intact  ASSESSMENT AND PLAN:  Discussed the following  assessment and plan:  #1 type 2 diabetes/prediabetes.  Not monitoring regularly at home -Future lab order for A1c.  Continue regular exercise and low glycemic diet  #2 hypertension-stable -Continue current medications  #3 dyslipidemia -Future order for lipids and hepatic panel  #4 hypothyroidism -Recheck TSH next week -Refilled levothyroxine for 1 year  #5 BPH symptoms improved on Flomax     I discussed the assessment and treatment plan with the patient. The patient was provided an opportunity to ask questions and all were answered. The patient agreed with the plan and demonstrated an understanding of the instructions.   The patient was advised to call back or seek an in-person evaluation if the symptoms worsen or if the condition fails to improve as anticipated.     Carolann Littler, MD

## 2018-09-11 ENCOUNTER — Other Ambulatory Visit: Payer: Self-pay

## 2018-09-11 ENCOUNTER — Other Ambulatory Visit (INDEPENDENT_AMBULATORY_CARE_PROVIDER_SITE_OTHER): Payer: Medicare PPO

## 2018-09-11 DIAGNOSIS — R739 Hyperglycemia, unspecified: Secondary | ICD-10-CM | POA: Diagnosis not present

## 2018-09-11 DIAGNOSIS — E785 Hyperlipidemia, unspecified: Secondary | ICD-10-CM

## 2018-09-11 DIAGNOSIS — E039 Hypothyroidism, unspecified: Secondary | ICD-10-CM | POA: Diagnosis not present

## 2018-09-11 DIAGNOSIS — I10 Essential (primary) hypertension: Secondary | ICD-10-CM | POA: Diagnosis not present

## 2018-09-11 LAB — LIPID PANEL
Cholesterol: 156 mg/dL (ref 0–200)
HDL: 32.7 mg/dL — ABNORMAL LOW (ref >39.00)
LDL Cholesterol: 89 mg/dL (ref 0–99)
NonHDL: 122.98
Total CHOL/HDL Ratio: 5
Triglycerides: 170 mg/dL — ABNORMAL HIGH (ref 0.0–149.0)
VLDL: 34 mg/dL (ref 0.0–40.0)

## 2018-09-11 LAB — HEPATIC FUNCTION PANEL
ALT: 50 U/L (ref 0–53)
AST: 26 U/L (ref 0–37)
Albumin: 4.1 g/dL (ref 3.5–5.2)
Alkaline Phosphatase: 69 U/L (ref 39–117)
Bilirubin, Direct: 0.2 mg/dL (ref 0.0–0.3)
Total Bilirubin: 0.8 mg/dL (ref 0.2–1.2)
Total Protein: 6.3 g/dL (ref 6.0–8.3)

## 2018-09-11 LAB — BASIC METABOLIC PANEL
BUN: 14 mg/dL (ref 6–23)
CO2: 29 mEq/L (ref 19–32)
Calcium: 8.9 mg/dL (ref 8.4–10.5)
Chloride: 103 mEq/L (ref 96–112)
Creatinine, Ser: 0.85 mg/dL (ref 0.40–1.50)
GFR: 90.15 mL/min (ref >60.00)
Glucose, Bld: 142 mg/dL — ABNORMAL HIGH (ref 70–99)
Potassium: 4.5 mEq/L (ref 3.5–5.1)
Sodium: 139 mEq/L (ref 135–145)

## 2018-09-11 LAB — TSH: TSH: 2.3 u[IU]/mL (ref 0.35–4.50)

## 2018-09-11 LAB — HEMOGLOBIN A1C: Hgb A1c MFr Bld: 8 % — ABNORMAL HIGH (ref 4.6–6.5)

## 2018-09-15 ENCOUNTER — Other Ambulatory Visit: Payer: Self-pay

## 2018-09-15 MED ORDER — METFORMIN HCL 500 MG PO TABS: 500.0000 mg | ORAL_TABLET | Freq: Two times a day (BID) | ORAL | 1 refills | Status: DC

## 2018-09-28 ENCOUNTER — Other Ambulatory Visit: Payer: Self-pay

## 2018-09-28 MED ORDER — AMLODIPINE BESYLATE 5 MG PO TABS: 5.0000 mg | ORAL_TABLET | Freq: Every day | ORAL | 1 refills | Status: DC

## 2018-10-12 ENCOUNTER — Other Ambulatory Visit: Payer: Self-pay | Admitting: Family Medicine

## 2018-11-15 ENCOUNTER — Other Ambulatory Visit: Payer: Self-pay | Admitting: Family Medicine

## 2018-12-15 ENCOUNTER — Ambulatory Visit: Payer: Medicare PPO | Admitting: Family Medicine

## 2018-12-19 ENCOUNTER — Other Ambulatory Visit: Payer: Self-pay

## 2018-12-19 ENCOUNTER — Ambulatory Visit (INDEPENDENT_AMBULATORY_CARE_PROVIDER_SITE_OTHER): Payer: Medicare PPO | Admitting: Family Medicine

## 2018-12-19 ENCOUNTER — Encounter: Payer: Self-pay | Admitting: Family Medicine

## 2018-12-19 VITALS — BP 124/76 | HR 72 | Temp 98.0°F | Ht 73.0 in | Wt 255.9 lb

## 2018-12-19 DIAGNOSIS — R739 Hyperglycemia, unspecified: Secondary | ICD-10-CM | POA: Diagnosis not present

## 2018-12-19 DIAGNOSIS — E1165 Type 2 diabetes mellitus with hyperglycemia: Secondary | ICD-10-CM

## 2018-12-19 DIAGNOSIS — I10 Essential (primary) hypertension: Secondary | ICD-10-CM | POA: Diagnosis not present

## 2018-12-19 DIAGNOSIS — E785 Hyperlipidemia, unspecified: Secondary | ICD-10-CM

## 2018-12-19 DIAGNOSIS — N400 Enlarged prostate without lower urinary tract symptoms: Secondary | ICD-10-CM | POA: Diagnosis not present

## 2018-12-19 LAB — POCT GLYCOSYLATED HEMOGLOBIN (HGB A1C): Hemoglobin A1C: 6.1 % — AB (ref 4.0–5.6)

## 2018-12-19 NOTE — Patient Instructions (Signed)
Your A1C is greatly improved at 6.1%   Keep up the good work.

## 2018-12-19 NOTE — Progress Notes (Signed)
  Subjective:     Patient ID: Barry Vazquez, male   DOB: June 22, 1950, 68 y.o.   MRN: [id number]  HPI Patient is here for medical follow-up.  He has history of hypothyroidism, hyperlipidemia, type 2 diabetes, hypertension, and gout.  He had recent A1c 8.0%.  We started metformin 500 mg twice daily which he is tolerating well.  He also has made some major dietary changes and is exercising more.  He has lost some weight.  Feels good overall.  He has had some elevated blood pressures by automated cuff but blood pressure much improved here today.  He has questions about accuracy of his home cuff.  Denies any recent chest pains or headaches.  [place] physically  Past Medical History:  Diagnosis Date  . Arthritis   . Cataract   . EYE FLOATERS 02/20/2009  . Gout, unspecified 02/20/2009  . HYPERLIPIDEMIA 08/26/2008  . HYPERTENSION 08/26/2008  . HYPOTHYROIDISM 08/26/2008  . SHOULDER PAIN 09/11/2009  . SYNCOPE 04/14/2010  . Unspecified visual disturbance 10/14/2009   Past Surgical History:  Procedure Laterality Date  . CARPAL TUNNEL RELEASE     left wrist  . EYE SURGERY    . KNEE ARTHROSCOPY     left X 2  . VASECTOMY      reports that he has never smoked. He has never used smokeless tobacco. He reports current alcohol use. He reports that he does not use drugs. family history includes Alzheimer's disease in his father; Crohn's disease in his father; Depression in an other family member; Hyperlipidemia in his mother; Hypertension in his mother; Stroke in his mother. No Known Allergies   Review of Systems  Constitutional: Negative for fatigue and unexpected weight change.  Eyes: Negative for visual disturbance.  Respiratory: Negative for cough, chest tightness and shortness of breath.   Cardiovascular: Negative for chest pain, palpitations and leg swelling.  Endocrine: Negative for polydipsia and polyuria.  Genitourinary: Negative for dysuria.   Neurological: Negative for dizziness, syncope, weakness, light-headedness and headaches.       Objective:   Physical Exam Constitutional:      Appearance: He is well-developed.  HENT:     Right Ear: External ear normal.     Left Ear: External ear normal.  Eyes:     Pupils: Pupils are equal, round, and reactive to light.  Neck:     Musculoskeletal: Neck supple.     Thyroid: No thyromegaly.  Cardiovascular:     Rate and Rhythm: Normal rate and regular rhythm.  Pulmonary:     Effort: Pulmonary effort is normal. No respiratory distress.     Breath sounds: Normal breath sounds. No wheezing or rales.  Neurological:     Mental Status: He is alert and oriented to person, place, and time.        Assessment:     #1 type 2 diabetes greatly improved with A1c down to 6.1%  #2 hyperlipidemia well controlled by recent labs  #3 hypertension.  Has had some home readings elevated but improved here today  #4 gout-stable on allopurinol    Plan:     -We recommend continued healthy lifestyle changes -Continue with yearly eye exam -Recommend 52-month routine follow-up and sooner as needed -Monitor blood pressures me in touch if consistently greater than 140/90  [name] MD [facility]

## 2019-03-17 ENCOUNTER — Other Ambulatory Visit: Payer: Self-pay | Admitting: Family Medicine

## 2019-04-02 ENCOUNTER — Other Ambulatory Visit: Payer: Self-pay | Admitting: Family Medicine

## 2019-04-19 ENCOUNTER — Other Ambulatory Visit: Payer: Self-pay | Admitting: Family Medicine

## 2019-05-06 ENCOUNTER — Other Ambulatory Visit: Payer: Self-pay | Admitting: Family Medicine

## 2019-05-14 ENCOUNTER — Other Ambulatory Visit: Payer: Self-pay | Admitting: Family Medicine

## 2019-08-08 ENCOUNTER — Other Ambulatory Visit: Payer: Self-pay | Admitting: Family Medicine

## 2019-08-13 ENCOUNTER — Other Ambulatory Visit: Payer: Self-pay | Admitting: Family Medicine

## 2019-09-04 ENCOUNTER — Other Ambulatory Visit: Payer: Self-pay | Admitting: Family Medicine

## 2019-09-07 ENCOUNTER — Telehealth: Payer: Self-pay | Admitting: Family Medicine

## 2019-09-07 NOTE — Telephone Encounter (Signed)
Pt notified that we sent this in on 08/08/19 with a 90 day supply and to call pharmacy

## 2019-09-07 NOTE — Telephone Encounter (Signed)
Pt is requesting a refill on Tamsulosin 0.4 mg caps. Pt uses [facility] in [place], [address]

## 2019-09-20 ENCOUNTER — Other Ambulatory Visit: Payer: Self-pay | Admitting: Family Medicine

## 2019-09-26 ENCOUNTER — Other Ambulatory Visit: Payer: Self-pay | Admitting: Family Medicine

## 2019-10-04 ENCOUNTER — Other Ambulatory Visit: Payer: Self-pay | Admitting: Family Medicine

## 2019-10-24 ENCOUNTER — Other Ambulatory Visit: Payer: Self-pay | Admitting: Family Medicine

## 2019-11-07 ENCOUNTER — Other Ambulatory Visit: Payer: Self-pay | Admitting: Family Medicine

## 2019-11-13 ENCOUNTER — Telehealth (INDEPENDENT_AMBULATORY_CARE_PROVIDER_SITE_OTHER): Payer: Medicare PPO | Admitting: Family Medicine

## 2019-11-13 DIAGNOSIS — E785 Hyperlipidemia, unspecified: Secondary | ICD-10-CM

## 2019-11-13 DIAGNOSIS — I10 Essential (primary) hypertension: Secondary | ICD-10-CM | POA: Diagnosis not present

## 2019-11-13 DIAGNOSIS — E1165 Type 2 diabetes mellitus with hyperglycemia: Secondary | ICD-10-CM

## 2019-11-13 DIAGNOSIS — N400 Enlarged prostate without lower urinary tract symptoms: Secondary | ICD-10-CM

## 2019-11-13 DIAGNOSIS — E039 Hypothyroidism, unspecified: Secondary | ICD-10-CM

## 2019-11-13 NOTE — Progress Notes (Signed)
Patient ID: Barry Vazquez, male   DOB: 05/25/1950, 69 y.o.   MRN: [id number]   This visit type was conducted due to national recommendations for restrictions regarding the COVID-19 pandemic in an effort to limit this patient's exposure and mitigate transmission in our community.   Virtual Visit via Video Note  I connected with Barry Vazquez on 11/13/19 at  3:15 PM EDT by a video enabled telemedicine application and verified that I am speaking with the correct person using two identifiers.  Location patient: home Location provider:work or home office Persons participating in the virtual visit: patient, provider  I discussed the limitations of evaluation and management by telemedicine and the availability of in person appointments. The patient expressed understanding and agreed to proceed.   HPI: Barry Vazquez has history of hypertension, hypothyroidism, type 2 diabetes, BPH, gout, hyperlipidemia.  He called today to discuss the following issues  Recent issues with nocturia sometimes up to 4-5 times at night.  He has some dribbling and slow stream.  Has pretty much eliminated alcohol.  Used to drink some beer.  He does drink tea frequently at night.  Last PSA was 1.77 back September 2019.  Denies any burning with urination.  No fevers or chills.  Definitely has some slow stream and dribbling streams at times.  He had interesting observation that he recently had some hemorrhoids which she felt were exacerbated by straining to empty his bladder.  He used some Preparation H and after using that his urinary symptoms seem to improve.  He is overdue for labs.  Last A1c was 6.1%.  Needs follow-up thyroid functions, lipid, hepatic, and electrolytes.  He is looking into trying to find a lab down near his current home near [place] to order some labs  He received call from his insurance company to consider colorectal screening.  His last colonoscopy apparently was 2009.  No reported history of polyps.   No family history of colon cancer.  No recent stool changes.   ROS: See pertinent positives and negatives per HPI.  Past Medical History:  Diagnosis Date  . Arthritis   . Cataract   . EYE FLOATERS 02/20/2009  . Gout, unspecified 02/20/2009  . HYPERLIPIDEMIA 08/26/2008  . HYPERTENSION 08/26/2008  . HYPOTHYROIDISM 08/26/2008  . SHOULDER PAIN 09/11/2009  . SYNCOPE 04/14/2010  . Unspecified visual disturbance 10/14/2009    Past Surgical History:  Procedure Laterality Date  . CARPAL TUNNEL RELEASE     left wrist  . EYE SURGERY    . KNEE ARTHROSCOPY     left X 2  . VASECTOMY      Family History  Problem Relation Age of Onset  . Depression Other   . Hyperlipidemia Mother   . Stroke Mother   . Hypertension Mother   . Crohn's disease Father   . Alzheimer's disease Father     SOCIAL HX: Non-smoker.  Rare alcohol use.   Current Outpatient Medications:  .  allopurinol (ZYLOPRIM) 300 MG tablet, Take 1 tablet (300 mg total) by mouth daily. Needs visit for further refills, Disp: 90 tablet, Rfl: 0 .  amLODipine (NORVASC) 5 MG tablet, TAKE 1 TABLET BY MOUTH EVERY DAY, Disp: 90 tablet, Rfl: 0 .  atorvastatin (LIPITOR) 40 MG tablet, TAKE 1 TABLET BY MOUTH EVERY DAY, Disp: 90 tablet, Rfl: 0 .  benazepril (LOTENSIN) 20 MG tablet, TAKE 1 TABLET BY MOUTH EVERY DAY, Disp: 90 tablet, Rfl: 0 .  ibuprofen (ADVIL,MOTRIN) 200 MG tablet, ibuprofen  prn, Disp: ,  Rfl:  .  levothyroxine (SYNTHROID) 112 MCG tablet, TAKE 1 TABLET BY MOUTH EVERY DAY, Disp: 90 tablet, Rfl: 0 .  metFORMIN (GLUCOPHAGE) 500 MG tablet, TAKE 1 TABLET BY MOUTH TWICE A DAY WITH MEALS, Disp: 60 tablet, Rfl: 0 .  tamsulosin (FLOMAX) 0.4 MG CAPS capsule, TAKE 1 CAPSULE BY MOUTH EVERY DAY, Disp: 90 capsule, Rfl: 0  EXAM:  VITALS per patient if applicable:  GENERAL: alert, oriented, appears well and in no acute distress  HEENT: atraumatic, conjunttiva clear, no obvious abnormalities on inspection of external nose and  ears  NECK: normal movements of the head and neck  LUNGS: on inspection no signs of respiratory distress, breathing rate appears normal, no obvious gross SOB, gasping or wheezing  CV: no obvious cyanosis  MS: moves all visible extremities without noticeable abnormality  PSYCH/NEURO: pleasant and cooperative, no obvious depression or anxiety, speech and thought processing grossly intact  ASSESSMENT AND PLAN:  Discussed the following assessment and plan:  #1 nocturia and slow stream.  History of known BPH.  Currently on tamsulosin 0.4 mg nightly.  May have some mild detrusor muscle instability but suspect largely related to BPH  -Limit fluid intake at night -Restrict alcohol and caffeine intake especially after about 2 PM -Continue tamsulosin 0.4 mg at night -We discussed possible addition of finasteride or Avodart but at this point he would like to observe  #2 type 2 diabetes.  History of good control.  Most recent A1c 6.1% but overdue  -He will look into local lab and we will plan to order labs including A1c, lipid panel, hepatic panel, basic metabolic panel, and TSH  #3 hypothyroidism-overdue for labs  #4 hyperlipidemia treated with statin.-Needs follow-up labs  #5 health maintenance.  Overdue for colon cancer screening. -He will check with his insurance to see if they cover Cologuard.  He will let us know and we can order.     I discussed the assessment and treatment plan with the patient. The patient was provided an opportunity to ask questions and all were answered. The patient agreed with the plan and demonstrated an understanding of the instructions.   The patient was advised to call back or seek an in-person evaluation if the symptoms worsen or if the condition fails to improve as anticipated.     Carolann Littler, MD

## 2019-11-14 ENCOUNTER — Encounter: Payer: Self-pay | Admitting: Family Medicine

## 2019-11-15 ENCOUNTER — Other Ambulatory Visit: Payer: Self-pay | Admitting: Family Medicine

## 2019-11-30 ENCOUNTER — Other Ambulatory Visit: Payer: Self-pay | Admitting: Family Medicine

## 2019-12-03 ENCOUNTER — Other Ambulatory Visit: Payer: Self-pay | Admitting: Family Medicine

## 2019-12-04 ENCOUNTER — Other Ambulatory Visit: Payer: Self-pay | Admitting: Family Medicine

## 2019-12-24 ENCOUNTER — Other Ambulatory Visit: Payer: Self-pay | Admitting: Family Medicine

## 2019-12-27 ENCOUNTER — Other Ambulatory Visit: Payer: Self-pay | Admitting: Family Medicine

## 2020-01-22 ENCOUNTER — Other Ambulatory Visit: Payer: Self-pay | Admitting: Family Medicine

## 2020-01-28 ENCOUNTER — Other Ambulatory Visit: Payer: Self-pay | Admitting: Family Medicine

## 2020-01-30 ENCOUNTER — Telehealth: Payer: Self-pay | Admitting: Family Medicine

## 2020-01-30 NOTE — Telephone Encounter (Signed)
Pt needs bloodwork done from previous visit with dr. Elease Hashimoto and lives in [place] and would like to go to [place] where he lives.  Pt needs a lab slip sent to: [facility]  [address], [place] , [place] [address]  [phone] phone [phone] fax

## 2020-01-30 NOTE — Telephone Encounter (Signed)
Can you write labs on a script pad and I will fax to the [facility] for patient?

## 2020-02-01 NOTE — Telephone Encounter (Signed)
Faxed to [facility] at number provided.

## 2020-02-01 NOTE — Telephone Encounter (Signed)
Written.

## 2020-02-07 NOTE — Telephone Encounter (Addendum)
error 

## 2020-02-08 ENCOUNTER — Other Ambulatory Visit: Payer: Self-pay | Admitting: Family Medicine

## 2020-02-08 ENCOUNTER — Encounter: Payer: Self-pay | Admitting: Family Medicine

## 2020-02-08 LAB — COLOGUARD: Cologuard: NEGATIVE

## 2020-02-09 LAB — LIPID PANEL W/O CHOL/HDL RATIO
Cholesterol, Total: 184 mg/dL (ref 100–199)
HDL: 36 mg/dL — ABNORMAL LOW (ref >39)
LDL Chol Calc (NIH): 108 mg/dL — ABNORMAL HIGH (ref 0–99)
Triglycerides: 231 mg/dL — ABNORMAL HIGH (ref 0–149)
VLDL Cholesterol Cal: 40 mg/dL (ref 5–40)

## 2020-02-09 LAB — BASIC METABOLIC PANEL
BUN/Creatinine Ratio: 18 (ref 10–24)
BUN: 15 mg/dL (ref 8–27)
CO2: 25 mmol/L (ref 20–29)
Calcium: 9.6 mg/dL (ref 8.6–10.2)
Chloride: 99 mmol/L (ref 96–106)
Creatinine, Ser: 0.83 mg/dL (ref 0.76–1.27)
GFR calc Af Amer: 105 mL/min/{1.73_m2} (ref >59)
GFR calc non Af Amer: 91 mL/min/{1.73_m2} (ref >59)
Glucose: 130 mg/dL — ABNORMAL HIGH (ref 65–99)
Potassium: 4.3 mmol/L (ref 3.5–5.2)
Sodium: 138 mmol/L (ref 134–144)

## 2020-02-09 LAB — HEPATIC FUNCTION PANEL
ALT: 28 IU/L (ref 0–44)
AST: 22 IU/L (ref 0–40)
Albumin: 4.7 g/dL (ref 3.8–4.8)
Alkaline Phosphatase: 81 IU/L (ref 44–121)
Bilirubin Total: 0.9 mg/dL (ref 0.0–1.2)
Bilirubin, Direct: 0.19 mg/dL (ref 0.00–0.40)
Total Protein: 7 g/dL (ref 6.0–8.5)

## 2020-02-09 LAB — HGB A1C W/O EAG: Hgb A1c MFr Bld: 6.4 % — ABNORMAL HIGH (ref 4.8–5.6)

## 2020-02-09 LAB — TSH: TSH: 2.5 u[IU]/mL (ref 0.450–4.500)

## 2020-02-09 LAB — SPECIMEN STATUS REPORT

## 2020-02-19 ENCOUNTER — Telehealth (INDEPENDENT_AMBULATORY_CARE_PROVIDER_SITE_OTHER): Payer: Medicare PPO | Admitting: Family Medicine

## 2020-02-19 ENCOUNTER — Encounter: Payer: Self-pay | Admitting: Family Medicine

## 2020-02-19 VITALS — Ht 73.0 in | Wt 255.0 lb

## 2020-02-19 DIAGNOSIS — N400 Enlarged prostate without lower urinary tract symptoms: Secondary | ICD-10-CM

## 2020-02-19 DIAGNOSIS — I10 Essential (primary) hypertension: Secondary | ICD-10-CM | POA: Diagnosis not present

## 2020-02-19 DIAGNOSIS — E039 Hypothyroidism, unspecified: Secondary | ICD-10-CM

## 2020-02-19 DIAGNOSIS — M109 Gout, unspecified: Secondary | ICD-10-CM

## 2020-02-19 DIAGNOSIS — E785 Hyperlipidemia, unspecified: Secondary | ICD-10-CM

## 2020-02-19 DIAGNOSIS — E1165 Type 2 diabetes mellitus with hyperglycemia: Secondary | ICD-10-CM | POA: Diagnosis not present

## 2020-02-19 MED ORDER — LEVOTHYROXINE SODIUM 112 MCG PO TABS
112.0000 ug | ORAL_TABLET | Freq: Every day | ORAL | 3 refills | Status: DC
Start: 2020-02-19 — End: 2021-03-12

## 2020-02-19 MED ORDER — BENAZEPRIL HCL 20 MG PO TABS
20.0000 mg | ORAL_TABLET | Freq: Every day | ORAL | 3 refills | Status: DC
Start: 2020-02-19 — End: 2021-03-23

## 2020-02-19 MED ORDER — AMLODIPINE BESYLATE 5 MG PO TABS
5.0000 mg | ORAL_TABLET | Freq: Every day | ORAL | 3 refills | Status: DC
Start: 2020-02-19 — End: 2021-03-12

## 2020-02-19 MED ORDER — TAMSULOSIN HCL 0.4 MG PO CAPS
0.4000 mg | ORAL_CAPSULE | Freq: Every day | ORAL | 3 refills | Status: DC
Start: 2020-02-19 — End: 2021-05-08

## 2020-02-19 MED ORDER — ATORVASTATIN CALCIUM 40 MG PO TABS
40.0000 mg | ORAL_TABLET | Freq: Every day | ORAL | 3 refills | Status: DC
Start: 2020-02-19 — End: 2021-04-06

## 2020-02-19 MED ORDER — ALLOPURINOL 300 MG PO TABS
300.0000 mg | ORAL_TABLET | Freq: Every day | ORAL | 3 refills | Status: DC
Start: 2020-02-19 — End: 2021-03-23

## 2020-02-19 MED ORDER — METFORMIN HCL 500 MG PO TABS
500.0000 mg | ORAL_TABLET | Freq: Two times a day (BID) | ORAL | 3 refills | Status: DC
Start: 2020-02-19 — End: 2021-03-12

## 2020-02-19 NOTE — Progress Notes (Signed)
Patient ID: Barry Vazquez, male   DOB: 1950/07/16, 69 y.o.   MRN: [id number]   This visit type was conducted due to national recommendations for restrictions regarding the COVID-19 pandemic in an effort to limit this patient's exposure and mitigate transmission in our community.   Virtual Visit via Video Note  I connected with Barry Vazquez on 02/19/20 at  3:00 PM EDT by a video enabled telemedicine application and verified that I am speaking with the correct person using two identifiers.  Location patient: home Location provider:work or home office Persons participating in the virtual visit: patient, provider  I discussed the limitations of evaluation and management by telemedicine and the availability of in person appointments. The patient expressed understanding and agreed to proceed.   HPI: Barry Vazquez has history of hypertension, hypothyroidism, type 2 diabetes, BPH, gout, hyperlipidemia.  [place].  Generally doing well.  He is golfing about 3 times per week.  He states that his arthritis has been remarkably stable.  He is doing some part-time work at [employer] course.  Generally doing very well.  His blood sugars been very stable.  He had recent lab work done at [facility] and that was sent here for our review.  His A1c was 6.4%.  Lipids are slightly elevated with LDL cholesterol 108.  No other concerning labs.  He states his PSA was 1.62.  BPH symptoms are stable on Flomax.  No recent dizziness or chest pains.  Compliant with all medications.   ROS: See pertinent positives and negatives per HPI.  Past Medical History:  Diagnosis Date   Arthritis    Cataract    EYE FLOATERS 02/20/2009   Gout, unspecified 02/20/2009   HYPERLIPIDEMIA 08/26/2008   HYPERTENSION 08/26/2008   HYPOTHYROIDISM 08/26/2008   SHOULDER PAIN 09/11/2009   SYNCOPE 04/14/2010   Unspecified visual disturbance 10/14/2009    Past Surgical History:  Procedure Laterality Date   CARPAL TUNNEL  RELEASE     left wrist   EYE SURGERY     KNEE ARTHROSCOPY     left X 2   VASECTOMY      Family History  Problem Relation Age of Onset   Depression Other    Hyperlipidemia Mother    Stroke Mother    Hypertension Mother    Crohn's disease Father    Alzheimer's disease Father     SOCIAL HX: Non-smoker  Current Outpatient Medications:    allopurinol (ZYLOPRIM) 300 MG tablet, TAKE 1 TABLET BY MOUTH EVERY DAY, Disp: 90 tablet, Rfl: 0   amLODipine (NORVASC) 5 MG tablet, TAKE 1 TABLET BY MOUTH EVERY DAY, Disp: 90 tablet, Rfl: 0   atorvastatin (LIPITOR) 40 MG tablet, TAKE 1 TABLET BY MOUTH EVERY DAY, Disp: 90 tablet, Rfl: 0   benazepril (LOTENSIN) 20 MG tablet, TAKE 1 TABLET BY MOUTH EVERY DAY, Disp: 90 tablet, Rfl: 0   ibuprofen (ADVIL,MOTRIN) 200 MG tablet, ibuprofen  prn, Disp: , Rfl:    levothyroxine (SYNTHROID) 112 MCG tablet, TAKE 1 TABLET BY MOUTH EVERY DAY, Disp: 90 tablet, Rfl: 0   metFORMIN (GLUCOPHAGE) 500 MG tablet, TAKE 1 TABLET BY MOUTH TWICE A DAY WITH MEALS, Disp: 60 tablet, Rfl: 0   tamsulosin (FLOMAX) 0.4 MG CAPS capsule, TAKE 1 CAPSULE BY MOUTH EVERY DAY, Disp: 90 capsule, Rfl: 0  EXAM:  VITALS per patient if applicable:  GENERAL: alert, oriented, appears well and in no acute distress  HEENT: atraumatic, conjunttiva clear, no obvious abnormalities on inspection of  external nose and ears  NECK: normal movements of the head and neck  LUNGS: on inspection no signs of respiratory distress, breathing rate appears normal, no obvious gross SOB, gasping or wheezing  CV: no obvious cyanosis  MS: moves all visible extremities without noticeable abnormality  PSYCH/NEURO: pleasant and cooperative, no obvious depression or anxiety, speech and thought processing grossly intact  ASSESSMENT AND PLAN:  Discussed the following assessment and plan:  #1 type 2 diabetes well controlled with recent A1c 6.4%  -Refill Metformin for 1 year -Recommend yearly  eye exam  #2 hypertension which has been stable -Refilled amlodipine and benazepril for 1 year  #3 BPH stable on Flomax -Refill Flomax for 1 year  #4 hyperlipidemia.  LDL cholesterol slightly elevated. -Discussed options of increasing Lipitor to 80 mg versus addition of Zetia versus observation.  He prefers the latter.  Continue low-saturated fat diet  #5 gout-very stable on allopurinol -Refill allopurinol for 1 year  #6 hypothyroidism.  Recent TSH at goal -Refill Synthroid for 1 year     I discussed the assessment and treatment plan with the patient. The patient was provided an opportunity to ask questions and all were answered. The patient agreed with the plan and demonstrated an understanding of the instructions.   The patient was advised to call back or seek an in-person evaluation if the symptoms worsen or if the condition fails to improve as anticipated.     Carolann Littler, MD

## 2020-02-25 LAB — COLOGUARD
COLOGUARD: NEGATIVE
Cologuard: NEGATIVE

## 2020-03-05 ENCOUNTER — Encounter: Payer: Self-pay | Admitting: Family Medicine

## 2020-05-14 ENCOUNTER — Ambulatory Visit: Payer: Medicare PPO

## 2020-05-28 ENCOUNTER — Other Ambulatory Visit: Payer: Self-pay

## 2020-05-28 ENCOUNTER — Ambulatory Visit (INDEPENDENT_AMBULATORY_CARE_PROVIDER_SITE_OTHER): Payer: Medicare PPO

## 2020-05-28 DIAGNOSIS — Z0000 Encounter for general adult medical examination without abnormal findings: Secondary | ICD-10-CM | POA: Diagnosis not present

## 2020-05-28 NOTE — Patient Instructions (Signed)
Mr. Barry Vazquez , Thank you for taking time to come for your Medicare Wellness Visit. I appreciate your ongoing commitment to your health goals. Please review the following plan we discussed and let me know if I can assist you in the future.   Screening recommendations/referrals: Colonoscopy: Pt stated done with last 6 months ( cologuard) Recommended yearly ophthalmology/optometry visit for glaucoma screening and checkup Recommended yearly dental visit for hygiene and checkup  Vaccinations: Influenza vaccine: Declined Pneumococcal vaccine: Declined Tdap vaccine: Up to date Shingles vaccine: Shingrix discussed. Please contact your pharmacy for coverage information.    Covid-19: Completed 09/10/19, 10/08/19, & 04/28/20  Advanced directives: Advance directive discussed with you today. Even though you declined this today please call our office should you change your mind and we can give you the proper paperwork for you to fill out.  Conditions/risks identified: Get more flexible next appointment: Follow up in one year for your annual wellness visit.   Preventive Care 27 Years and Older, Male Preventive care refers to lifestyle choices and visits with your health care provider that can promote health and wellness. What does preventive care include?  A yearly physical exam. This is also called an annual well check.  Dental exams once or twice a year.  Routine eye exams. Ask your health care provider how often you should have your eyes checked.  Personal lifestyle choices, including:  Daily care of your teeth and gums.  Regular physical activity.  Eating a healthy diet.  Avoiding tobacco and drug use.  Limiting alcohol use.  Practicing safe sex.  Taking low doses of aspirin every day.  Taking vitamin and mineral supplements as recommended by your health care provider. What happens during an annual well check? The services and screenings done by your health care provider during your  annual well check will depend on your age, overall health, lifestyle risk factors, and family history of disease. Counseling  Your health care provider may ask you questions about your:  Alcohol use.  Tobacco use.  Drug use.  Emotional well-being.  Home and relationship well-being.  Sexual activity.  Eating habits.  History of falls.  Memory and ability to understand (cognition).  Work and work Statistician. Screening  You may have the following tests or measurements:  Height, weight, and BMI.  Blood pressure.  Lipid and cholesterol levels. These may be checked every 5 years, or more frequently if you are over 68 years old.  Skin check.  Lung cancer screening. You may have this screening every year starting at age 68 if you have a 30-pack-year history of smoking and currently smoke or have quit within the past 15 years.  Fecal occult blood test (FOBT) of the stool. You may have this test every year starting at age 68.  Flexible sigmoidoscopy or colonoscopy. You may have a sigmoidoscopy every 5 years or a colonoscopy every 10 years starting at age 68.  Prostate cancer screening. Recommendations will vary depending on your family history and other risks.  Hepatitis C blood test.  Hepatitis B blood test.  Sexually transmitted disease (STD) testing.  Diabetes screening. This is done by checking your blood sugar (glucose) after you have not eaten for a while (fasting). You may have this done every 1-3 years.  Abdominal aortic aneurysm (AAA) screening. You may need this if you are a current or former smoker.  Osteoporosis. You may be screened starting at age 38 if you are at high risk. Talk with your health care provider about  your test results, treatment options, and if necessary, the need for more tests. Vaccines  Your health care provider may recommend certain vaccines, such as:  Influenza vaccine. This is recommended every year.  Tetanus, diphtheria, and  acellular pertussis (Tdap, Td) vaccine. You may need a Td booster every 10 years.  Zoster vaccine. You may need this after age 40.  Pneumococcal 13-valent conjugate (PCV13) vaccine. One dose is recommended after age 27.  Pneumococcal polysaccharide (PPSV23) vaccine. One dose is recommended after age 74. Talk to your health care provider about which screenings and vaccines you need and how often you need them. This information is not intended to replace advice given to you by your health care provider. Make sure you discuss any questions you have with your health care provider. Document Released: 05/16/2015 Document Revised: 01/07/2016 Document Reviewed: 02/18/2015 Elsevier Interactive Patient Education  2017 [place] Prevention in the Home Falls can cause injuries. They can happen to people of all ages. There are many things you can do to make your home safe and to help prevent falls. What can I do on the outside of my home?  Regularly fix the edges of walkways and driveways and fix any cracks.  Remove anything that might make you trip as you walk through a door, such as a raised step or threshold.  Trim any bushes or trees on the path to your home.  Use bright outdoor lighting.  Clear any walking paths of anything that might make someone trip, such as rocks or tools.  Regularly check to see if handrails are loose or broken. Make sure that both sides of any steps have handrails.  Any raised decks and porches should have guardrails on the edges.  Have any leaves, snow, or ice cleared regularly.  Use sand or salt on walking paths during winter.  Clean up any spills in your garage right away. This includes oil or grease spills. What can I do in the bathroom?  Use night lights.  Install grab bars by the toilet and in the tub and shower. Do not use towel bars as grab bars.  Use non-skid mats or decals in the tub or shower.  If you need to sit down in the shower, use  a plastic, non-slip stool.  Keep the floor dry. Clean up any water that spills on the floor as soon as it happens.  Remove soap buildup in the tub or shower regularly.  Attach bath mats securely with double-sided non-slip rug tape.  Do not have throw rugs and other things on the floor that can make you trip. What can I do in the bedroom?  Use night lights.  Make sure that you have a light by your bed that is easy to reach.  Do not use any sheets or blankets that are too big for your bed. They should not hang down onto the floor.  Have a firm chair that has side arms. You can use this for support while you get dressed.  Do not have throw rugs and other things on the floor that can make you trip. What can I do in the kitchen?  Clean up any spills right away.  Avoid walking on wet floors.  Keep items that you use a lot in easy-to-reach places.  If you need to reach something above you, use a strong step stool that has a grab bar.  Keep electrical cords out of the way.  Do not use floor polish or wax  that makes floors slippery. If you must use wax, use non-skid floor wax.  Do not have throw rugs and other things on the floor that can make you trip. What can I do with my stairs?  Do not leave any items on the stairs.  Make sure that there are handrails on both sides of the stairs and use them. Fix handrails that are broken or loose. Make sure that handrails are as long as the stairways.  Check any carpeting to make sure that it is firmly attached to the stairs. Fix any carpet that is loose or worn.  Avoid having throw rugs at the top or bottom of the stairs. If you do have throw rugs, attach them to the floor with carpet tape.  Make sure that you have a light switch at the top of the stairs and the bottom of the stairs. If you do not have them, ask someone to add them for you. What else can I do to help prevent falls?  Wear shoes that:  Do not have high heels.  Have  rubber bottoms.  Are comfortable and fit you well.  Are closed at the toe. Do not wear sandals.  If you use a stepladder:  Make sure that it is fully opened. Do not climb a closed stepladder.  Make sure that both sides of the stepladder are locked into place.  Ask someone to hold it for you, if possible.  Clearly mark and make sure that you can see:  Any grab bars or handrails.  First and last steps.  Where the edge of each step is.  Use tools that help you move around (mobility aids) if they are needed. These include:  Canes.  Walkers.  Scooters.  Crutches.  Turn on the lights when you go into a dark area. Replace any light bulbs as soon as they burn out.  Set up your furniture so you have a clear path. Avoid moving your furniture around.  If any of your floors are uneven, fix them.  If there are any pets around you, be aware of where they are.  Review your medicines with your doctor. Some medicines can make you feel dizzy. This can increase your chance of falling. Ask your doctor what other things that you can do to help prevent falls. This information is not intended to replace advice given to you by your health care provider. Make sure you discuss any questions you have with your health care provider. Document Released: 02/13/2009 Document Revised: 09/25/2015 Document Reviewed: 05/24/2014 Elsevier Interactive Patient Education  2017 [employer].

## 2020-05-28 NOTE — Progress Notes (Signed)
Virtual Visit via Telephone Note  I connected with  Barry Vazquez on 05/28/20 at  8:00 AM EST by telephone and verified that I am speaking with the correct person using two identifiers.  Location: Patient: Home Provider: Office Persons participating in the virtual visit: patient/Nurse Health Advisor   I discussed the limitations, risks, security and privacy concerns of performing an evaluation and management service by telephone and the availability of in person appointments. The patient expressed understanding and agreed to proceed.  Interactive audio and video telecommunications were attempted between this nurse and patient, however failed, due to patient having technical difficulties OR patient did not have access to video capability.  We continued and completed visit with audio only.  Some vital signs may be absent or patient reported.   Willette Brace, LPN    Subjective:   Barry Vazquez is a 70 y.o. male who presents for an Initial Medicare Annual Wellness Visit.  Review of Systems     Cardiac Risk Factors include: advanced age (>69men, >33 women);diabetes mellitus;dyslipidemia;male gender;hypertension     Objective:    There were no vitals filed for this visit. There is no height or weight on file to calculate BMI.  Advanced Directives 05/28/2020  Does Patient Have a Medical Advance Directive? No  Would patient like information on creating a medical advance directive? No - Patient declined    Current Medications (verified) Outpatient Encounter Medications as of 05/28/2020  Medication Sig  . allopurinol (ZYLOPRIM) 300 MG tablet Take 1 tablet (300 mg total) by mouth daily.  Marland Kitchen amLODipine (NORVASC) 5 MG tablet Take 1 tablet (5 mg total) by mouth daily.  Marland Kitchen atorvastatin (LIPITOR) 40 MG tablet Take 1 tablet (40 mg total) by mouth daily.  . benazepril (LOTENSIN) 20 MG tablet Take 1 tablet (20 mg total) by mouth daily.  Marland Kitchen ibuprofen (ADVIL,MOTRIN) 200 MG tablet  ibuprofen  prn  . levothyroxine (SYNTHROID) 112 MCG tablet Take 1 tablet (112 mcg total) by mouth daily.  . metFORMIN (GLUCOPHAGE) 500 MG tablet Take 1 tablet (500 mg total) by mouth 2 (two) times daily with a meal.  . tamsulosin (FLOMAX) 0.4 MG CAPS capsule Take 1 capsule (0.4 mg total) by mouth daily.   No facility-administered encounter medications on file as of 05/28/2020.    Allergies (verified) Patient has no known allergies.   History: Past Medical History:  Diagnosis Date  . Arthritis   . Cataract   . EYE FLOATERS 02/20/2009  . Gout, unspecified 02/20/2009  . HYPERLIPIDEMIA 08/26/2008  . HYPERTENSION 08/26/2008  . HYPOTHYROIDISM 08/26/2008  . SHOULDER PAIN 09/11/2009  . SYNCOPE 04/14/2010  . Unspecified visual disturbance 10/14/2009   Past Surgical History:  Procedure Laterality Date  . CARPAL TUNNEL RELEASE     left wrist  . EYE SURGERY    . KNEE ARTHROSCOPY     left X 2  . VASECTOMY     Family History  Problem Relation Age of Onset  . Depression Other   . Hyperlipidemia Mother   . Stroke Mother   . Hypertension Mother   . Crohn's disease Father   . Alzheimer's disease Father    Social History   Socioeconomic History  . Marital status: Married    Spouse name: Not on file  . Number of children: Not on file  . Years of education: Not on file  . Highest education level: Not on file  Occupational History  . Not on file  Tobacco Use  . Smoking  status: Never Smoker  . Smokeless tobacco: Never Used  Vaping Use  . Vaping Use: Never used  Substance and Sexual Activity  . Alcohol use: Yes    Comment: Occasionally  . Drug use: No  . Sexual activity: Not on file  Other Topics Concern  . Not on file  Social History Narrative  . Not on file   Social Determinants of Health   Financial Resource Strain: Low Risk   . Difficulty of Paying Living Expenses: Not hard at all  Food Insecurity: No Food Insecurity  . Worried About Charity fundraiser in the Last  Year: Never true  . Ran Out of Food in the Last Year: Never true  Transportation Needs: No Transportation Needs  . Lack of Transportation (Medical): No  . Lack of Transportation (Non-Medical): No  Physical Activity: Inactive  . Days of Exercise per Week: 0 days  . Minutes of Exercise per Session: 0 min  Stress: No Stress Concern Present  . Feeling of Stress : Not at all  Social Connections: Moderately Integrated  . Frequency of Communication with Friends and Family: More than three times a week  . Frequency of Social Gatherings with Friends and Family: More than three times a week  . Attends Religious Services: More than 4 times per year  . Active Member of Clubs or Organizations: No  . Attends Archivist Meetings: Never  . Marital Status: Married    Tobacco Counseling Counseling given: Not Answered   Clinical Intake:  Pre-visit preparation completed: Yes  Pain : No/denies pain     BMI - recorded: 33.65 Nutritional Status: BMI > 30  Obese Nutritional Risks: None Diabetes: Yes CBG done?: No Did pt. bring in CBG monitor from home?: No     Diabetic?Nutrition Risk Assessment:  Has the patient had any N/V/D within the last 2 months?  No  Does the patient have any non-healing wounds?  No  Has the patient had any unintentional weight loss or weight gain?  No   Diabetes:  Is the patient diabetic?  Yes  If diabetic, was a CBG obtained today?  No  Did the patient bring in their glucometer from home?  No  How often do you monitor your CBG's? Twice a day.   Financial Strains and Diabetes Management:  Are you having any financial strains with the device, your supplies or your medication? No .  Does the patient want to be seen by Chronic Care Management for management of their diabetes?  No  Would the patient like to be referred to a Nutritionist or for Diabetic Management?  No   Diabetic Exams:  Diabetic Eye Exam: Overdue for diabetic eye exam. Pt has been  advised about the importance in completing this exam. Patient advised to call and schedule an eye exam. Diabetic Foot Exam: Overdue, Pt has been advised about the importance in completing this exam. Pt is scheduled for diabetic foot exam on per next follow up appt .   Interpreter Needed?: No  Information entered by :: Charlott Rakes, LPN   Activities of Daily Living In your present state of health, do you have any difficulty performing the following activities: 05/28/2020  Hearing? N  Vision? N  Difficulty concentrating or making decisions? N  Walking or climbing stairs? N  Dressing or bathing? N  Doing errands, shopping? N  Preparing Food and eating ? N  Using the Toilet? N  In the past six months, have you accidently leaked urine?  N  Do you have problems with loss of bowel control? N  Managing your Medications? N  Managing your Finances? N  Housekeeping or managing your Housekeeping? N  Some recent data might be hidden    Patient Care Team: [name], MD as PCP - General  Indicate any recent Medical Services you may have received from other than [facility] providers in the past year (date may be approximate).     Assessment:   This is a routine wellness examination for Barry Vazquez.  Hearing/Vision screen  Hearing Screening   125Hz  250Hz  500Hz  1000Hz  2000Hz  3000Hz  4000Hz  6000Hz  8000Hz   Right ear:           Left ear:           Comments: Pt denies any hearing issues   Dietary issues and exercise activities discussed: Current Exercise Habits: The patient has a physically strenuous job, but has no regular exercise apart from work. (also plays golf)  Goals    . Patient Stated     Be more flexibile      Depression Screen PHQ 2/9 Scores 05/28/2020 08/22/2017  PHQ - 2 Score 0 0  PHQ- 9 Score - 0    Fall Risk Fall Risk  05/28/2020 08/22/2017  Falls in the past year? 0 No  Number falls in past yr: 0 -  Injury with Fall? 0 -  Follow up Falls prevention discussed -     FALL RISK PREVENTION PERTAINING TO THE HOME:  Any stairs in or around the home? No  If so, are there any without handrails? No  Home free of loose throw rugs in walkways, pet beds, electrical cords, etc? Yes  Adequate lighting in your home to reduce risk of falls? Yes   ASSISTIVE DEVICES UTILIZED TO PREVENT FALLS:  Life alert? No  Use of a cane, walker or w/c? No  Grab bars in the bathroom? No  Shower chair or bench in shower? No  Elevated toilet seat or a handicapped toilet? No   TIMED UP AND GO:  Was the test performed? No .     Cognitive Function: D  eclined 6CIT        Immunizations Immunization History  Administered Date(s) Administered  . Influenza,inj,quad, With Preservative 03/03/2017  . Moderna Sars-Covid-2 Vaccination 09/10/2019, 10/08/2019, 04/28/2020  . Td 05/03/2005  . Tdap 12/23/2015    TDAP status: Up to date  Flu Vaccine status: Declined, Education has been provided regarding the importance of this vaccine but patient still declined. Advised may receive this vaccine at local pharmacy or Health Dept. Aware to provide a copy of the vaccination record if obtained from local pharmacy or Health Dept. Verbalized acceptance and understanding.  Pneumococcal vaccine status: Declined,  Education has been provided regarding the importance of this vaccine but patient still declined. Advised may receive this vaccine at local pharmacy or Health Dept. Aware to provide a copy of the vaccination record if obtained from local pharmacy or Health Dept. Verbalized acceptance and understanding.   Covid-19 vaccine status: Completed vaccines  Qualifies for Shingles Vaccine? Yes   Zostavax completed No   Shingrix Completed?: No.    Education has been provided regarding the importance of this vaccine. Patient has been advised to call insurance company to determine out of pocket expense if they have not yet received this vaccine. Advised may also receive vaccine at local  pharmacy or Health Dept. Verbalized acceptance and understanding.  Screening Tests Health Maintenance  Topic Date Due  .  Hepatitis C Screening  Never done  . FOOT EXAM  07/01/2020 (Originally 08/06/1960)  . OPHTHALMOLOGY EXAM  07/01/2020 (Originally 04/15/2017)  . INFLUENZA VACCINE  07/31/2020 (Originally 12/02/2019)  . PNA vac Low Risk Adult (1 of 2 - PCV13) 05/28/2021 (Originally 08/05/2017)  . HEMOGLOBIN A1C  08/08/2020  . TETANUS/TDAP  12/22/2025  . COLONOSCOPY (Pts 45-31yrs Insurance coverage will need to be confirmed)  01/06/2030  . COVID-19 Vaccine  Completed    Health Maintenance  Health Maintenance Due  Topic Date Due  . Hepatitis C Screening  Never done    Colorectal cancer screening : Pt stated that he had cologuard done within last 6 months    Additional Screening:  Hepatitis C Screening: does qualify  Vision Screening: Recommended annual ophthalmology exams for early detection of glaucoma and other disorders of the eye. Is the patient up to date with their annual eye exam?  No  Who is the provider or what is the name of the office in which the patient attends annual eye exams? Pt states he will follow up for an appt to have eyes examined    Dental Screening: Recommended annual dental exams for proper oral hygiene  Community Resource Referral / Chronic Care Management: CRR required this visit?  No   CCM required this visit?  No      Plan:     I have personally reviewed and noted the following in the patient's chart:   . Medical and social history . Use of alcohol, tobacco or illicit drugs  . Current medications and supplements . Functional ability and status . Nutritional status . Physical activity . Advanced directives . List of other physicians . Hospitalizations, surgeries, and ER visits in previous 12 months . Vitals . Screenings to include cognitive, depression, and falls . Referrals and appointments  In addition, I have reviewed and discussed  with patient certain preventive protocols, quality metrics, and best practice recommendations. A written personalized care plan for preventive services as well as general preventive health recommendations were provided to patient.     Willette Brace, LPN   [id number]   Nurse Notes: None

## 2020-09-11 LAB — HM DIABETES EYE EXAM

## 2021-02-21 ENCOUNTER — Other Ambulatory Visit: Payer: Self-pay | Admitting: Family Medicine

## 2021-03-12 ENCOUNTER — Telehealth: Payer: Self-pay

## 2021-03-12 ENCOUNTER — Other Ambulatory Visit: Payer: Self-pay | Admitting: Family Medicine

## 2021-03-12 NOTE — Telephone Encounter (Signed)
Last VV for chronic medical issues 02/19/20. Pt notified of this; states he will call back when he is available to make appt.

## 2021-03-23 ENCOUNTER — Other Ambulatory Visit: Payer: Self-pay | Admitting: Family Medicine

## 2021-04-04 ENCOUNTER — Other Ambulatory Visit: Payer: Self-pay | Admitting: Family Medicine

## 2021-04-15 ENCOUNTER — Telehealth: Payer: Self-pay | Admitting: Family Medicine

## 2021-04-15 NOTE — Telephone Encounter (Signed)
L/m asking patient to call [phone]  Schedule change Please r/s 06/03/21 awv appt

## 2021-05-01 ENCOUNTER — Other Ambulatory Visit: Payer: Self-pay | Admitting: Family Medicine

## 2021-05-05 ENCOUNTER — Telehealth: Payer: Self-pay

## 2021-05-05 NOTE — Telephone Encounter (Signed)
Spoke with the patient. Appointment has been scheduled.  

## 2021-05-05 NOTE — Telephone Encounter (Signed)
Pt called into the office to report he had gone to the urgent care on yesterday 05/04/2020 where he was diagnosed with a sinus infection he stated during that visit he had a B/P reading of 178/86. He took his B/p 2 times today and had a reading of 189/89 and 183/90. He stated he has also been taking Mucinex. He would like advise on what to do next.

## 2021-05-06 ENCOUNTER — Ambulatory Visit (INDEPENDENT_AMBULATORY_CARE_PROVIDER_SITE_OTHER): Payer: Medicare PPO | Admitting: Family Medicine

## 2021-05-06 VITALS — BP 178/90 | HR 82 | Temp 97.7°F | Ht 73.0 in | Wt 251.9 lb

## 2021-05-06 DIAGNOSIS — E1165 Type 2 diabetes mellitus with hyperglycemia: Secondary | ICD-10-CM | POA: Diagnosis not present

## 2021-05-06 DIAGNOSIS — E785 Hyperlipidemia, unspecified: Secondary | ICD-10-CM

## 2021-05-06 DIAGNOSIS — I10 Essential (primary) hypertension: Secondary | ICD-10-CM

## 2021-05-06 DIAGNOSIS — E039 Hypothyroidism, unspecified: Secondary | ICD-10-CM | POA: Diagnosis not present

## 2021-05-06 DIAGNOSIS — M109 Gout, unspecified: Secondary | ICD-10-CM

## 2021-05-06 MED ORDER — CHLORTHALIDONE 25 MG PO TABS: 25.0000 mg | ORAL_TABLET | Freq: Every day | ORAL | 3 refills | Status: DC

## 2021-05-06 NOTE — Progress Notes (Signed)
Established Patient Office Visit  Subjective:  Patient ID: Barry Vazquez, male    DOB: May 11, 1950  Age: 71 y.o. MRN: [id number]  CC:  Chief Complaint  Patient presents with   Hypertension    Noted to be elevated in UC x 3 days s/p covid 12/14    HPI  Barry Vazquez presents for medical follow-up.  He is concerned about some recent elevated blood pressure readings.  He has longstanding history of hypertension and also has history of controlled type 2 diabetes, hypothyroidism, BPH, hyperlipidemia, and gout.  Gout well-controlled on allopurinol.  He recently was seen in urgent care and had COVID infection apparently back in mid December.  Has had some very high blood pressure readings since then up in the 160 systolic.  Current medications include allopurinol, amlodipine 5 mg daily, atorvastatin 40 mg daily, benazepril 20 mg daily, levothyroxine 112 mcg daily, metformin 500 mg twice daily, and tamsulosin 0.4 mg daily.  Denies any headaches or chest pains.  He continues to golf fairly regularly.  Overdue for labs.  No regular alcohol use. Has recently developed some late day leg edema.  No dyspnea.  No orthopnea.  Past Medical History:  Diagnosis Date   Arthritis    Cataract    EYE FLOATERS 02/20/2009   Gout, unspecified 02/20/2009   HYPERLIPIDEMIA 08/26/2008   HYPERTENSION 08/26/2008   HYPOTHYROIDISM 08/26/2008   SHOULDER PAIN 09/11/2009   SYNCOPE 04/14/2010   Unspecified visual disturbance 10/14/2009    Past Surgical History:  Procedure Laterality Date   CARPAL TUNNEL RELEASE     left wrist   EYE SURGERY     KNEE ARTHROSCOPY     left X 2   VASECTOMY      Family History  Problem Relation Age of Onset   Depression Other    Hyperlipidemia Mother    Stroke Mother    Hypertension Mother    Crohn's disease Father    Alzheimer's disease Father     Social History   Socioeconomic History   Marital status: Married    Spouse name: Not on file   Number of children:  Not on file   Years of education: Not on file   Highest education level: Not on file  Occupational History   Not on file  Tobacco Use   Smoking status: Never   Smokeless tobacco: Never  Vaping Use   Vaping Use: Never used  Substance and Sexual Activity   Alcohol use: Yes    Comment: Occasionally   Drug use: No   Sexual activity: Not on file  Other Topics Concern   Not on file  Social History Narrative   Not on file   Social Determinants of Health   Financial Resource Strain: Low Risk    Difficulty of Paying Living Expenses: Not hard at all  Food Insecurity: No Food Insecurity   Worried About Charity fundraiser in the Last Year: Never true   Arboriculturist in the Last Year: Never true  Transportation Needs: No Transportation Needs   Lack of Transportation (Medical): No   Lack of Transportation (Non-Medical): No  Physical Activity: Inactive   Days of Exercise per Week: 0 days   Minutes of Exercise per Session: 0 min  Stress: No Stress Concern Present   Feeling of Stress : Not at all  Social Connections: Moderately Integrated   Frequency of Communication with Friends and Family: More than three times a week   Frequency of  Social Gatherings with Friends and Family: More than three times a week   Attends Religious Services: More than 4 times per year   Active Member of [employer] or Organizations: No   Attends Music therapist: Never   Marital Status: Married  Human resources officer Violence: Not At Risk   Fear of Current or Ex-Partner: No   Emotionally Abused: No   Physically Abused: No   Sexually Abused: No    Outpatient Medications Prior to Visit  Medication Sig Dispense Refill   allopurinol (ZYLOPRIM) 300 MG tablet TAKE 1 TABLET BY MOUTH EVERY DAY 90 tablet 0   amLODipine (NORVASC) 5 MG tablet TAKE 1 TABLET BY MOUTH EVERY DAY 90 tablet 3   atorvastatin (LIPITOR) 40 MG tablet TAKE 1 TABLET BY MOUTH EVERY DAY 30 tablet 0   benazepril (LOTENSIN) 20 MG tablet TAKE  1 TABLET BY MOUTH EVERY DAY 90 tablet 0   ibuprofen (ADVIL,MOTRIN) 200 MG tablet ibuprofen  prn     levothyroxine (SYNTHROID) 112 MCG tablet TAKE 1 TABLET BY MOUTH EVERY DAY 90 tablet 3   metFORMIN (GLUCOPHAGE) 500 MG tablet TAKE 1 TABLET BY MOUTH 2 TIMES DAILY WITH A MEAL. 180 tablet 3   tamsulosin (FLOMAX) 0.4 MG CAPS capsule Take 1 capsule (0.4 mg total) by mouth daily. 90 capsule 3   No facility-administered medications prior to visit.    No Known Allergies  ROS Review of Systems  Constitutional:  Negative for fatigue.  Eyes:  Negative for visual disturbance.  Respiratory:  Negative for cough, chest tightness and shortness of breath.   Cardiovascular:  Positive for leg swelling. Negative for chest pain and palpitations.  Neurological:  Negative for dizziness, syncope, weakness, light-headedness and headaches.     Objective:    Physical Exam Constitutional:      Appearance: He is well-developed.  HENT:     Right Ear: External ear normal.     Left Ear: External ear normal.  Eyes:     Pupils: Pupils are equal, round, and reactive to light.  Neck:     Thyroid: No thyromegaly.  Cardiovascular:     Rate and Rhythm: Normal rate and regular rhythm.  Pulmonary:     Effort: Pulmonary effort is normal. No respiratory distress.     Breath sounds: Normal breath sounds. No wheezing or rales.  Musculoskeletal:     Cervical back: Neck supple.     Right lower leg: Edema present.     Left lower leg: Edema present.  Neurological:     Mental Status: He is alert and oriented to person, place, and time.    BP (!) 178/90 (BP Location: Right Arm, Cuff Size: Normal)    Pulse 82    Temp 97.7 F (36.5 C) (Oral)    Ht 6\' 1"  (1.854 m)    Wt 251 lb 14.4 oz (114.3 kg)    SpO2 97%    BMI 33.23 kg/m  Wt Readings from Last 3 Encounters:  05/06/21 251 lb 14.4 oz (114.3 kg)  02/19/20 255 lb (115.7 kg)  12/19/18 255 lb 14.4 oz (116.1 kg)     Health Maintenance Due  Topic Date Due   Pneumonia  Vaccine 41+ Years old (1 - PCV) Never done   FOOT EXAM  Never done   Hepatitis C Screening  Never done   Zoster Vaccines- Shingrix (1 of 2) Never done   COVID-19 Vaccine (4 - Booster for Moderna series) 06/23/2020   HEMOGLOBIN A1C  08/08/2020  INFLUENZA VACCINE  12/01/2020    There are no preventive care reminders to display for this patient.  Lab Results  Component Value Date   TSH 2.500 02/08/2020   Lab Results  Component Value Date   WBC 7.1 12/18/2015   HGB 14.6 12/18/2015   HCT 42.1 12/18/2015   MCV 89.2 12/18/2015   PLT 206.0 12/18/2015   Lab Results  Component Value Date   NA 138 02/08/2020   K 4.3 02/08/2020   CO2 25 02/08/2020   GLUCOSE 130 (H) 02/08/2020   BUN 15 02/08/2020   CREATININE 0.83 02/08/2020   BILITOT 0.9 02/08/2020   ALKPHOS 81 02/08/2020   AST 22 02/08/2020   ALT 28 02/08/2020   PROT 7.0 02/08/2020   ALBUMIN 4.7 02/08/2020   CALCIUM 9.6 02/08/2020   GFR 90.15 09/11/2018   Lab Results  Component Value Date   CHOL 184 02/08/2020   Lab Results  Component Value Date   HDL 36 (L) 02/08/2020   Lab Results  Component Value Date   LDLCALC 108 (H) 02/08/2020   Lab Results  Component Value Date   TRIG 231 (H) 02/08/2020   Lab Results  Component Value Date   CHOLHDL 5 09/11/2018   Lab Results  Component Value Date   HGBA1C 6.4 (H) 02/08/2020      Assessment & Plan:   #1 hypertension.  Poorly controlled.  Repeat blood pressure after rest 178/90.  We discussed options.  Would avoid further titration of amlodipine with his recent increased edema.  He is already on ACE inhibitor and calcium channel blocker.  We discussed possible addition of chlorthalidone but have to be careful to watch for potential gout flare.  However, he is on allopurinol and gout has been very well controlled.  We will add chlorthalidone 25 mg daily and increase potassium rich foods -Check basic metabolic panel -He will continue to monitor blood pressures closely at  home.  He is [facility] [place] [facility] and will try to send [place] back some readings over the next few weeks and we will adjust medications accordingly  #2 hypothyroidism.  Patient on levothyroxine.  Recheck TSH  #3 hyperlipidemia treated with atorvastatin.  Recheck lipid and hepatic panel  #4 gout currently stable on allopurinol.  Continue low purine diet  #5 type 2 diabetes.  This has been controlled in the past.  Recheck A1c today.   Meds ordered this encounter  Medications   chlorthalidone (HYGROTON) 25 MG tablet    Sig: Take 1 tablet (25 mg total) by mouth daily.    Dispense:  90 tablet    Refill:  3    Follow-up: No follow-ups on file.    Carolann Littler, MD

## 2021-05-06 NOTE — Patient Instructions (Signed)
Give me some feedback in the next month regarding blood pressures.    Try to keep daily sodium intake < 2,500 mg daily

## 2021-05-07 LAB — LIPID PANEL
Cholesterol: 154 mg/dL (ref 0–200)
HDL: 33.2 mg/dL — ABNORMAL LOW (ref >39.00)
LDL Cholesterol: 89 mg/dL (ref 0–99)
NonHDL: 120.83
Total CHOL/HDL Ratio: 5
Triglycerides: 161 mg/dL — ABNORMAL HIGH (ref 0.0–149.0)
VLDL: 32.2 mg/dL (ref 0.0–40.0)

## 2021-05-07 LAB — HEPATIC FUNCTION PANEL
ALT: 25 U/L (ref 0–53)
AST: 19 U/L (ref 0–37)
Albumin: 4.2 g/dL (ref 3.5–5.2)
Alkaline Phosphatase: 68 U/L (ref 39–117)
Bilirubin, Direct: 0.2 mg/dL (ref 0.0–0.3)
Total Bilirubin: 0.6 mg/dL (ref 0.2–1.2)
Total Protein: 7.2 g/dL (ref 6.0–8.3)

## 2021-05-07 LAB — BASIC METABOLIC PANEL
BUN: 18 mg/dL (ref 6–23)
CO2: 32 mEq/L (ref 19–32)
Calcium: 9.7 mg/dL (ref 8.4–10.5)
Chloride: 103 mEq/L (ref 96–112)
Creatinine, Ser: 1.21 mg/dL (ref 0.40–1.50)
GFR: 61.42 mL/min (ref >60.00)
Glucose, Bld: 114 mg/dL — ABNORMAL HIGH (ref 70–99)
Potassium: 4.5 mEq/L (ref 3.5–5.1)
Sodium: 142 mEq/L (ref 135–145)

## 2021-05-07 LAB — HEMOGLOBIN A1C: Hgb A1c MFr Bld: 6.2 % (ref 4.6–6.5)

## 2021-05-07 LAB — TSH: TSH: 2.68 u[IU]/mL (ref 0.35–5.50)

## 2021-05-08 ENCOUNTER — Other Ambulatory Visit: Payer: Self-pay | Admitting: Family Medicine

## 2021-05-15 ENCOUNTER — Telehealth: Payer: Self-pay | Admitting: Family Medicine

## 2021-05-15 NOTE — Telephone Encounter (Signed)
Pt said dr Elease Hashimoto has agreed to see his son Barry Vazquez who will be a new pt. Ok to sch?

## 2021-05-18 NOTE — Telephone Encounter (Signed)
Pt son has been sch for 05-29-2021

## 2021-05-19 ENCOUNTER — Encounter: Payer: Self-pay | Admitting: Family Medicine

## 2021-05-22 ENCOUNTER — Other Ambulatory Visit: Payer: Self-pay | Admitting: Family Medicine

## 2021-05-27 ENCOUNTER — Ambulatory Visit: Payer: Medicare PPO

## 2021-06-03 ENCOUNTER — Ambulatory Visit: Payer: Medicare PPO

## 2021-06-18 ENCOUNTER — Other Ambulatory Visit: Payer: Self-pay | Admitting: Family Medicine

## 2021-07-16 ENCOUNTER — Other Ambulatory Visit: Payer: Self-pay | Admitting: Family Medicine

## 2021-07-30 ENCOUNTER — Telehealth: Payer: Self-pay

## 2021-07-30 NOTE — Telephone Encounter (Signed)
Pt last OV 05/06/21. Poorly controlled HTN noted at that time. No f/u appt made. ?LVM instructions for pt to schedule 6 month f/u 11/04/21 or after. ?

## 2021-08-05 ENCOUNTER — Other Ambulatory Visit: Payer: Self-pay | Admitting: Family Medicine

## 2021-08-05 DIAGNOSIS — I10 Essential (primary) hypertension: Secondary | ICD-10-CM

## 2021-08-05 DIAGNOSIS — M109 Gout, unspecified: Secondary | ICD-10-CM

## 2021-08-07 ENCOUNTER — Other Ambulatory Visit: Payer: Self-pay | Admitting: Family Medicine

## 2021-08-07 DIAGNOSIS — I10 Essential (primary) hypertension: Secondary | ICD-10-CM

## 2021-08-07 DIAGNOSIS — M109 Gout, unspecified: Secondary | ICD-10-CM

## 2021-08-12 ENCOUNTER — Other Ambulatory Visit: Payer: Self-pay | Admitting: Family Medicine

## 2021-08-12 DIAGNOSIS — M109 Gout, unspecified: Secondary | ICD-10-CM

## 2021-08-13 ENCOUNTER — Telehealth: Payer: Self-pay | Admitting: Family Medicine

## 2021-08-13 ENCOUNTER — Other Ambulatory Visit: Payer: Self-pay | Admitting: Family Medicine

## 2021-08-13 DIAGNOSIS — M109 Gout, unspecified: Secondary | ICD-10-CM

## 2021-08-13 NOTE — Telephone Encounter (Signed)
Rx previously sent. 

## 2021-08-13 NOTE — Telephone Encounter (Signed)
Barry Vazquez from [place] ( [id number] [address] [place] called regarding allopurinol (ZYLOPRIM) 300 MG tablet, stating they do not have the prescription. According to system "08/05/21 1106 Pend Interface, Surescripts Out/E-Prescribing Status: Receipt confirmed by pharmacy (08/05/2021 11:49 AM EDT)) Please assist.  ? ? ?

## 2021-08-20 NOTE — Telephone Encounter (Signed)
Pt declines to set up appt at this time. He says he does not want to make the 7hr drive here & would like to know if he sends his BP readings via mychart "will that suffice". I told him that typically that is not what the providers is looking for in lieu of in person OV for HTN, but that Dr Elease Hashimoto would have say that is ok. Pt requests that I send this ask to PCP for determination.  ?

## 2021-08-21 NOTE — Telephone Encounter (Addendum)
Lvm for patient to call back for message also sent patient My Chart message.  ?

## 2021-09-22 ENCOUNTER — Other Ambulatory Visit: Payer: Self-pay | Admitting: Family Medicine

## 2021-09-22 DIAGNOSIS — I10 Essential (primary) hypertension: Secondary | ICD-10-CM

## 2021-10-19 ENCOUNTER — Telehealth: Payer: Self-pay | Admitting: Family Medicine

## 2021-10-19 NOTE — Telephone Encounter (Signed)
Left message for patient to call back and schedule Medicare Annual Wellness Visit (AWV) either virtually or in office. Left  my Barry Vazquez number [phone]   Last AWV 05/28/20  please schedule at anytime with [facility] Nurse Health Advisor 1 or 2

## 2021-10-22 ENCOUNTER — Encounter: Payer: Self-pay | Admitting: Family Medicine

## 2021-10-23 ENCOUNTER — Other Ambulatory Visit: Payer: Self-pay

## 2021-10-23 MED ORDER — BENAZEPRIL HCL 40 MG PO TABS: 40.0000 mg | ORAL_TABLET | Freq: Every day | ORAL | 0 refills | Status: DC

## 2021-11-08 ENCOUNTER — Other Ambulatory Visit: Payer: Self-pay | Admitting: Family Medicine

## 2021-11-08 DIAGNOSIS — M109 Gout, unspecified: Secondary | ICD-10-CM

## 2021-12-18 ENCOUNTER — Other Ambulatory Visit: Payer: Self-pay | Admitting: Family Medicine

## 2021-12-18 DIAGNOSIS — I10 Essential (primary) hypertension: Secondary | ICD-10-CM

## 2021-12-22 ENCOUNTER — Ambulatory Visit (INDEPENDENT_AMBULATORY_CARE_PROVIDER_SITE_OTHER): Payer: Medicare PPO

## 2021-12-22 VITALS — Ht 73.0 in | Wt 229.0 lb

## 2021-12-22 DIAGNOSIS — Z0000 Encounter for general adult medical examination without abnormal findings: Secondary | ICD-10-CM | POA: Diagnosis not present

## 2021-12-22 NOTE — Patient Instructions (Addendum)
Barry Vazquez , Thank you for taking time to come for your Medicare Wellness Visit. I appreciate your ongoing commitment to your health goals. Please review the following plan we discussed and let me know if I can assist you in the future.   These are the goals we discussed:  Goals       Patient Stated      Be more flexibile      Patient stated (pt-stated)      Improve flexablity        This is a list of the screening recommended for you and due dates:  Health Maintenance  Topic Date Due   Complete foot exam   Never done   Eye exam for diabetics  09/11/2021   Hemoglobin A1C  11/03/2021   Flu Shot  12/01/2021   COVID-19 Vaccine (4 - Moderna series) 01/07/2022*   Zoster (Shingles) Vaccine (1 of 2) 03/24/2022*   Pneumonia Vaccine (1 - PCV) 12/23/2022*   Hepatitis C Screening: USPSTF Recommendation to screen - Ages 18-79 yo.  12/23/2022*   Tetanus Vaccine  12/22/2025   Colon Cancer Screening  01/06/2030   HPV Vaccine  Aged Out  *Topic was postponed. The date shown is not the original due date.   Advanced directives: No  Conditions/risks identified: None  Next appointment: Follow up in one year for your annual wellness visit.    Preventive Care 82 Years and Older, Male Preventive care refers to lifestyle choices and visits with your health care provider that can promote health and wellness. What does preventive care include? A yearly physical exam. This is also called an annual well check. Dental exams once or twice a year. Routine eye exams. Ask your health care provider how often you should have your eyes checked. Personal lifestyle choices, including: Daily care of your teeth and gums. Regular physical activity. Eating a healthy diet. Avoiding tobacco and drug use. Limiting alcohol use. Practicing safe sex. Taking low doses of aspirin every day. Taking vitamin and mineral supplements as recommended by your health care provider. What happens during an annual well  check? The services and screenings done by your health care provider during your annual well check will depend on your age, overall health, lifestyle risk factors, and family history of disease. Counseling  Your health care provider may ask you questions about your: Alcohol use. Tobacco use. Drug use. Emotional well-being. Home and relationship well-being. Sexual activity. Eating habits. History of falls. Memory and ability to understand (cognition). Work and work Statistician. Screening  You may have the following tests or measurements: Height, weight, and BMI. Blood pressure. Lipid and cholesterol levels. These may be checked every 5 years, or more frequently if you are over 56 years old. Skin check. Lung cancer screening. You may have this screening every year starting at age 45 if you have a 30-pack-year history of smoking and currently smoke or have quit within the past 15 years. Fecal occult blood test (FOBT) of the stool. You may have this test every year starting at age 34. Flexible sigmoidoscopy or colonoscopy. You may have a sigmoidoscopy every 5 years or a colonoscopy every 10 years starting at age 71. Prostate cancer screening. Recommendations will vary depending on your family history and other risks. Hepatitis C blood test. Hepatitis B blood test. Sexually transmitted disease (STD) testing. Diabetes screening. This is done by checking your blood sugar (glucose) after you have not eaten for a while (fasting). You may have this done every 1-3 years.  Abdominal aortic aneurysm (AAA) screening. You may need this if you are a current or former smoker. Osteoporosis. You may be screened starting at age 44 if you are at high risk. Talk with your health care provider about your test results, treatment options, and if necessary, the need for more tests. Vaccines  Your health care provider may recommend certain vaccines, such as: Influenza vaccine. This is recommended every  year. Tetanus, diphtheria, and acellular pertussis (Tdap, Td) vaccine. You may need a Td booster every 10 years. Zoster vaccine. You may need this after age 62. Pneumococcal 13-valent conjugate (PCV13) vaccine. One dose is recommended after age 47. Pneumococcal polysaccharide (PPSV23) vaccine. One dose is recommended after age 74. Talk to your health care provider about which screenings and vaccines you need and how often you need them. This information is not intended to replace advice given to you by your health care provider. Make sure you discuss any questions you have with your health care provider. Document Released: 05/16/2015 Document Revised: 01/07/2016 Document Reviewed: 02/18/2015 Elsevier Interactive Patient Education  2017 [employer] Prevention in the Home Falls can cause injuries. They can happen to people of all ages. There are many things you can do to make your home safe and to help prevent falls. What can I do on the outside of my home? Regularly fix the edges of walkways and driveways and fix any cracks. Remove anything that might make you trip as you walk through a door, such as a raised step or threshold. Trim any bushes or trees on the path to your home. Use bright outdoor lighting. Clear any walking paths of anything that might make someone trip, such as rocks or tools. Regularly check to see if handrails are loose or broken. Make sure that both sides of any steps have handrails. Any raised decks and porches should have guardrails on the edges. Have any leaves, snow, or ice cleared regularly. Use sand or salt on walking paths during winter. Clean up any spills in your garage right away. This includes oil or grease spills. What can I do in the bathroom? Use night lights. Install grab bars by the toilet and in the tub and shower. Do not use towel bars as grab bars. Use non-skid mats or decals in the tub or shower. If you need to sit down in the shower, use a  plastic, non-slip stool. Keep the floor dry. Clean up any water that spills on the floor as soon as it happens. Remove soap buildup in the tub or shower regularly. Attach bath mats securely with double-sided non-slip rug tape. Do not have throw rugs and other things on the floor that can make you trip. What can I do in the bedroom? Use night lights. Make sure that you have a light by your bed that is easy to reach. Do not use any sheets or blankets that are too big for your bed. They should not hang down onto the floor. Have a firm chair that has side arms. You can use this for support while you get dressed. Do not have throw rugs and other things on the floor that can make you trip. What can I do in the kitchen? Clean up any spills right away. Avoid walking on wet floors. Keep items that you use a lot in easy-to-reach places. If you need to reach something above you, use a strong step stool that has a grab bar. Keep electrical cords out of the way. Do not  use floor polish or wax that makes floors slippery. If you must use wax, use non-skid floor wax. Do not have throw rugs and other things on the floor that can make you trip. What can I do with my stairs? Do not leave any items on the stairs. Make sure that there are handrails on both sides of the stairs and use them. Fix handrails that are broken or loose. Make sure that handrails are as long as the stairways. Check any carpeting to make sure that it is firmly attached to the stairs. Fix any carpet that is loose or worn. Avoid having throw rugs at the top or bottom of the stairs. If you do have throw rugs, attach them to the floor with carpet tape. Make sure that you have a light switch at the top of the stairs and the bottom of the stairs. If you do not have them, ask someone to add them for you. What else can I do to help prevent falls? Wear shoes that: Do not have high heels. Have rubber bottoms. Are comfortable and fit you  well. Are closed at the toe. Do not wear sandals. If you use a stepladder: Make sure that it is fully opened. Do not climb a closed stepladder. Make sure that both sides of the stepladder are locked into place. Ask someone to hold it for you, if possible. Clearly mark and make sure that you can see: Any grab bars or handrails. First and last steps. Where the edge of each step is. Use tools that help you move around (mobility aids) if they are needed. These include: Canes. Walkers. Scooters. Crutches. Turn on the lights when you go into a dark area. Replace any light bulbs as soon as they burn out. Set up your furniture so you have a clear path. Avoid moving your furniture around. If any of your floors are uneven, fix them. If there are any pets around you, be aware of where they are. Review your medicines with your doctor. Some medicines can make you feel dizzy. This can increase your chance of falling. Ask your doctor what other things that you can do to help prevent falls. This information is not intended to replace advice given to you by your health care provider. Make sure you discuss any questions you have with your health care provider. Document Released: 02/13/2009 Document Revised: 09/25/2015 Document Reviewed: 05/24/2014 Elsevier Interactive Patient Education  2017 [employer].

## 2021-12-22 NOTE — Progress Notes (Signed)
Subjective:   Barry Vazquez is a 71 y.o. male who presents for Medicare Annual/Subsequent preventive examination.  Review of Systems    Virtual Visit via Telephone Note  I connected with  Barry Vazquez on 12/22/21 at  1:45 PM EDT by telephone and verified that I am speaking with the correct person using two identifiers.  Location: Patient: Home Provider: Office Persons participating in the virtual visit: patient/Nurse Health Advisor   I discussed the limitations, risks, security and privacy concerns of performing an evaluation and management service by telephone and the availability of in person appointments. The patient expressed understanding and agreed to proceed.  Interactive audio and video telecommunications were attempted between this nurse and patient, however failed, due to patient having technical difficulties OR patient did not have access to video capability.  We continued and completed visit with audio only.  Some vital signs may be absent or patient reported.   Barry Peaches, LPN  Cardiac Risk Factors include: advanced age (>59mn, >>48women);hypertension;male gender     Objective:    Today's Vitals   12/22/21 1339  Weight: 229 lb (103.9 kg)  Height: '6\' 1"'$  (1.854 m)   Body mass index is 30.21 kg/m.     12/22/2021    1:49 PM 05/28/2020    8:20 AM  Advanced Directives  Does Patient Have a Medical Advance Directive? No No  Would patient like information on creating a medical advance directive? No - Patient declined No - Patient declined    Current Medications (verified) Outpatient Encounter Medications as of 12/22/2021  Medication Sig   allopurinol (ZYLOPRIM) 300 MG tablet TAKE 1 TABLET BY MOUTH EVERY DAY   amLODipine (NORVASC) 5 MG tablet TAKE 1 TABLET BY MOUTH EVERY DAY   atorvastatin (LIPITOR) 40 MG tablet TAKE 1 TABLET BY MOUTH EVERY DAY   benazepril (LOTENSIN) 40 MG tablet Take 1 tablet (40 mg total) by mouth daily.   chlorthalidone  (HYGROTON) 25 MG tablet Take 1 tablet (25 mg total) by mouth daily.   ibuprofen (ADVIL,MOTRIN) 200 MG tablet ibuprofen  prn   levothyroxine (SYNTHROID) 112 MCG tablet TAKE 1 TABLET BY MOUTH EVERY DAY   metFORMIN (GLUCOPHAGE) 500 MG tablet TAKE 1 TABLET BY MOUTH 2 TIMES DAILY WITH A MEAL.   [DISCONTINUED] tamsulosin (FLOMAX) 0.4 MG CAPS capsule TAKE 1 CAPSULE BY MOUTH EVERY DAY   No facility-administered encounter medications on file as of 12/22/2021.    Allergies (verified) Patient has no known allergies.   History: Past Medical History:  Diagnosis Date   Arthritis    Cataract    EYE FLOATERS 02/20/2009   Gout, unspecified 02/20/2009   HYPERLIPIDEMIA 08/26/2008   HYPERTENSION 08/26/2008   HYPOTHYROIDISM 08/26/2008   SHOULDER PAIN 09/11/2009   SYNCOPE 04/14/2010   Unspecified visual disturbance 10/14/2009   Past Surgical History:  Procedure Laterality Date   CARPAL TUNNEL RELEASE     left wrist   EYE SURGERY     KNEE ARTHROSCOPY     left X 2   VASECTOMY     Family History  Problem Relation Age of Onset   Depression Other    Hyperlipidemia Mother    Stroke Mother    Hypertension Mother    Crohn's disease Father    Alzheimer's disease Father    Social History   Socioeconomic History   Marital status: Married    Spouse name: Not on file   Number of children: Not on file   Years of education:  Not on file   Highest education level: Not on file  Occupational History   Not on file  Tobacco Use   Smoking status: Never   Smokeless tobacco: Never  Vaping Use   Vaping Use: Never used  Substance and Sexual Activity   Alcohol use: Yes    Comment: Occasionally   Drug use: No   Sexual activity: Not on file  Other Topics Concern   Not on file  Social History Narrative   Not on file   Social Determinants of Health   Financial Resource Strain: Low Risk  (12/22/2021)   Overall Financial Resource Strain (CARDIA)    Difficulty of Paying Living Expenses: Not hard at all   Food Insecurity: No Food Insecurity (12/22/2021)   Hunger Vital Sign    Worried About Running Out of Food in the Last Year: Never true    Ran Out of Food in the Last Year: Never true  Transportation Needs: No Transportation Needs (12/22/2021)   PRAPARE - Hydrologist (Medical): No    Lack of Transportation (Non-Medical): No  Physical Activity: Sufficiently Active (12/22/2021)   Exercise Vital Sign    Days of Exercise per Week: 6 days    Minutes of Exercise per Session: 100 min  Stress: No Stress Concern Present (12/22/2021)   [place]    Feeling of Stress : Not at all  Social Connections: [place] (12/22/2021)   Social Connection and Isolation Panel [NHANES]    Frequency of Communication with Friends and Family: More than three times a week    Frequency of Social Gatherings with Friends and Family: More than three times a week    Attends Religious Services: More than 4 times per year    Active Member of [employer] or Organizations: Yes    Attends Music therapist: More than 4 times per year    Marital Status: Married    Tobacco Counseling Counseling given: Not Answered   Clinical Intake:  Pre-visit preparation completed: No  Pain : No/denies pain     BMI - recorded: 33.24 Nutritional Status: BMI > 30  Obese Nutritional Risks: None Diabetes: No  How often do you need to have someone help you when you read instructions, pamphlets, or other written materials from your doctor or pharmacy?: 1 - Never  Diabetic?  No  Interpreter Needed?: No  Information entered by :: Rolene Arbour LPN   Activities of Daily Living    12/22/2021    1:47 PM  In your present state of health, do you have any difficulty performing the following activities:  Hearing? 0  Vision? 0  Difficulty concentrating or making decisions? 0  Walking or climbing stairs? 0  Dressing or  bathing? 0  Doing errands, shopping? 0  Preparing Food and eating ? N  Using the Toilet? N  In the past six months, have you accidently leaked urine? N  Do you have problems with loss of bowel control? N  Managing your Medications? N  Managing your Finances? N  Housekeeping or managing your Housekeeping? N    Patient Care Team: [name], MD as PCP - General  Indicate any recent Medical Services you may have received from other than [facility] providers in the past year (date may be approximate).     Assessment:   This is a routine wellness examination for Barry Vazquez.  Hearing/Vision screen Hearing Screening - Comments:: No hearing difficulty Vision  Screening - Comments:: Wears glasses.  Dietary issues and exercise activities discussed: Exercise limited by: None identified   Goals Addressed               This Visit's Progress     Patient stated (pt-stated)        Improve flexablity       Depression Screen    12/22/2021    1:45 PM 05/06/2021    4:20 PM 05/28/2020    8:17 AM 08/22/2017   10:51 AM  PHQ 2/9 Scores  PHQ - 2 Score 0 0 0 0  PHQ- 9 Score    0    Fall Risk    12/22/2021    1:48 PM 05/06/2021    4:19 PM 05/28/2020    8:21 AM 08/22/2017   10:51 AM  [place] in the past year? 0 0 0 No  Number falls in past yr: 0 0 0   Injury with Fall? 0 0 0   Risk for fall due to : No Fall Risks No Fall Risks    Follow up  Falls evaluation completed Falls prevention discussed     [place]:  Any stairs in or around the home? No  If so, are there any without handrails? No  Home free of loose throw rugs in walkways, pet beds, electrical cords, etc? Yes  Adequate lighting in your home to reduce risk of falls? Yes   ASSISTIVE DEVICES UTILIZED TO PREVENT FALLS:  Life alert? No  Use of a cane, walker or w/c? No  Grab bars in the bathroom? No  Shower chair or bench in shower? No  Elevated toilet seat or a handicapped  toilet? No   TIMED UP AND GO:  Was the test performed? No . Audio Visit   Cognitive Function:        12/22/2021    1:49 PM  6CIT Screen  What Year? 0 points  What month? 0 points  What time? 0 points  Count back from 20 0 points  Months in reverse 0 points  Repeat phrase 0 points  Total Score 0 points    Immunizations Immunization History  Administered Date(s) Administered   Influenza,inj,quad, With Preservative 03/03/2017   Moderna Sars-Covid-2 Vaccination 09/10/2019, 10/08/2019, 04/28/2020   Td 05/03/2005   Tdap 12/23/2015    TDAP status: Up to date  Flu Vaccine status: Up to date  Pneumococcal vaccine status: Due, Education has been provided regarding the importance of this vaccine. Advised may receive this vaccine at local pharmacy or Health Dept. Aware to provide a copy of the vaccination record if obtained from local pharmacy or Health Dept. Verbalized acceptance and understanding.  Covid-19 vaccine status: Completed vaccines  Qualifies for Shingles Vaccine? Yes   Zostavax completed No   Shingrix Completed?: No.    Education has been provided regarding the importance of this vaccine. Patient has been advised to call insurance company to determine out of pocket expense if they have not yet received this vaccine. Advised may also receive vaccine at local pharmacy or Health Dept. Verbalized acceptance and understanding.  Screening Tests Health Maintenance  Topic Date Due   FOOT EXAM  Never done   OPHTHALMOLOGY EXAM  09/11/2021   HEMOGLOBIN A1C  11/03/2021   INFLUENZA VACCINE  12/01/2021   COVID-19 Vaccine (4 - Moderna series) 01/07/2022 (Originally 06/23/2020)   Zoster Vaccines- Shingrix (1 of 2) 03/24/2022 (Originally 08/06/2002)   Pneumonia Vaccine 65+ Years  old (1 - PCV) 12/23/2022 (Originally 08/05/2017)   Hepatitis C Screening  12/23/2022 (Originally 08/06/1968)   TETANUS/TDAP  12/22/2025   COLONOSCOPY (Pts 45-70yr Insurance coverage will need to be confirmed)   01/06/2030   HPV VACCINES  Aged Out    Health Maintenance  Health Maintenance Due  Topic Date Due   FOOT EXAM  Never done   OPHTHALMOLOGY EXAM  09/11/2021   HEMOGLOBIN A1C  11/03/2021   INFLUENZA VACCINE  12/01/2021    Colorectal cancer screening: Type of screening: Colonoscopy. Completed 01/07/20. Repeat every 10 years  Lung Cancer Screening: (Low Dose CT Chest recommended if Age 69-80years, 30 pack-year currently smoking OR have quit w/in 15years.) does not qualify.    Additional Screening:  Hepatitis C Screening: does qualify; Completed Patient deferred  Vision Screening: Recommended annual ophthalmology exams for early detection of glaucoma and other disorders of the eye. Is the patient up to date with their annual eye exam?  Yes  Who is the provider or what is the name of the office in which the patient attends annual eye exams? Patient deferred If pt is not established with a provider, would they like to be referred to a provider to establish care? No .   Dental Screening: Recommended annual dental exams for proper oral hygiene  Community Resource Referral / Chronic Care Management:  CRR required this visit?  No   CCM required this visit?  No      Plan:     I have personally reviewed and noted the following in the patient's chart:   Medical and social history Use of alcohol, tobacco or illicit drugs  Current medications and supplements including opioid prescriptions. Patient is not currently taking opioid prescriptions. Functional ability and status Nutritional status Physical activity Advanced directives List of other physicians Hospitalizations, surgeries, and ER visits in previous 12 months Vitals Screenings to include cognitive, depression, and falls Referrals and appointments  In addition, I have reviewed and discussed with patient certain preventive protocols, quality metrics, and best practice recommendations. A written personalized care plan for  preventive services as well as general preventive health recommendations were provided to patient.     BCriselda Peaches LPN   [id number]  Nurse Notes: Patient due labs Hep-C Screening and Hemoglbin A1C

## 2022-01-17 ENCOUNTER — Other Ambulatory Visit: Payer: Self-pay | Admitting: Family Medicine

## 2022-01-25 ENCOUNTER — Other Ambulatory Visit: Payer: Self-pay | Admitting: Family Medicine

## 2022-02-04 ENCOUNTER — Other Ambulatory Visit: Payer: Self-pay | Admitting: Family Medicine

## 2022-02-04 DIAGNOSIS — M109 Gout, unspecified: Secondary | ICD-10-CM

## 2022-03-17 ENCOUNTER — Other Ambulatory Visit: Payer: Self-pay | Admitting: Family Medicine

## 2022-03-26 ENCOUNTER — Other Ambulatory Visit: Payer: Self-pay | Admitting: Family Medicine

## 2022-04-16 ENCOUNTER — Other Ambulatory Visit: Payer: Self-pay | Admitting: Family Medicine

## 2022-04-25 ENCOUNTER — Other Ambulatory Visit: Payer: Self-pay | Admitting: Family Medicine

## 2022-04-26 NOTE — Telephone Encounter (Signed)
Last OV 05/06/21 notes below: #1 hypertension.  Poorly controlled.  Repeat blood pressure after rest 178/90.  We discussed options.  Would avoid further titration of amlodipine with his recent increased edema.  He is already on ACE inhibitor and calcium channel blocker.  We discussed possible addition of chlorthalidone but have to be careful to watch for potential gout flare.  However, he is on allopurinol and gout has been very well controlled.  We will add chlorthalidone 25 mg daily and increase potassium rich foods   No future appts scheduled.

## 2022-05-05 ENCOUNTER — Other Ambulatory Visit: Payer: Self-pay | Admitting: Family Medicine

## 2022-05-05 DIAGNOSIS — M109 Gout, unspecified: Secondary | ICD-10-CM

## 2022-06-10 ENCOUNTER — Telehealth: Payer: Self-pay | Admitting: Family Medicine

## 2022-06-10 NOTE — Telephone Encounter (Signed)
Pt states he has a missed call from this office. I could not find a note. Pt stated it may be in reference to the medical records his insurance company will be contacting MD for.  Pt is asking for a call back.

## 2022-06-11 NOTE — Telephone Encounter (Signed)
Left message for the patient to return my call.

## 2022-06-15 NOTE — Telephone Encounter (Signed)
I spoke with the patient and he provided verbal ok for release of information to his insurance

## 2022-06-17 ENCOUNTER — Other Ambulatory Visit: Payer: Self-pay | Admitting: Family Medicine

## 2022-07-02 ENCOUNTER — Other Ambulatory Visit: Payer: Self-pay | Admitting: Family Medicine

## 2022-07-11 ENCOUNTER — Other Ambulatory Visit: Payer: Self-pay | Admitting: Family Medicine

## 2022-07-15 ENCOUNTER — Other Ambulatory Visit: Payer: Self-pay | Admitting: Family Medicine

## 2022-07-27 ENCOUNTER — Other Ambulatory Visit: Payer: Self-pay | Admitting: Family Medicine

## 2022-07-29 ENCOUNTER — Other Ambulatory Visit: Payer: Self-pay | Admitting: Family Medicine

## 2022-07-30 ENCOUNTER — Other Ambulatory Visit: Payer: Self-pay | Admitting: Family Medicine

## 2022-07-31 ENCOUNTER — Other Ambulatory Visit: Payer: Self-pay | Admitting: Family Medicine

## 2022-07-31 DIAGNOSIS — M109 Gout, unspecified: Secondary | ICD-10-CM

## 2022-08-07 ENCOUNTER — Other Ambulatory Visit: Payer: Self-pay | Admitting: Family Medicine

## 2022-08-11 ENCOUNTER — Other Ambulatory Visit: Payer: Self-pay | Admitting: Family Medicine

## 2022-08-18 ENCOUNTER — Other Ambulatory Visit: Payer: Self-pay | Admitting: Family Medicine

## 2022-08-18 ENCOUNTER — Telehealth: Payer: Self-pay | Admitting: Family Medicine

## 2022-08-18 MED ORDER — CHLORTHALIDONE 25 MG PO TABS: 25.0000 mg | ORAL_TABLET | Freq: Every day | ORAL | 0 refills | Status: DC

## 2022-08-18 NOTE — Telephone Encounter (Signed)
Prescription Request  08/18/2022  LOV: Visit date not found  What is the name of the medication or equipment? chlorthalidone (HYGROTON) 25 MG tablet   Have you contacted your pharmacy to request a refill? Yes   Which pharmacy would you like this sent to?    [id number] - [place], [place] - [address] [place] [address] Phone: [phone] Fax: [phone]    Patient notified that their request is being sent to the clinical staff for review and that they should receive a response within 2 business days.   Please advise at Mobile [phone] (mobile)

## 2022-08-18 NOTE — Telephone Encounter (Signed)
Rx sent 

## 2022-08-22 ENCOUNTER — Other Ambulatory Visit: Payer: Self-pay | Admitting: Family Medicine

## 2022-08-25 ENCOUNTER — Other Ambulatory Visit: Payer: Self-pay | Admitting: Family Medicine

## 2022-08-26 ENCOUNTER — Other Ambulatory Visit: Payer: Self-pay | Admitting: Family Medicine

## 2022-08-29 ENCOUNTER — Other Ambulatory Visit: Payer: Self-pay | Admitting: Family Medicine

## 2022-08-30 ENCOUNTER — Telehealth: Payer: Self-pay | Admitting: Family Medicine

## 2022-08-30 DIAGNOSIS — M109 Gout, unspecified: Secondary | ICD-10-CM

## 2022-08-30 NOTE — Telephone Encounter (Signed)
Pt is calling and he is 3.5 hrs away in would like refills on allopurinol (ZYLOPRIM) 300 MG tablet , benazepril (LOTENSIN) 40 MG tablet , chlorthalidone (HYGROTON) 25 MG tablet . Pt is aware he needs an appt. Pt states he does not know when he will be in town. Pt would like order to have blood work  to go to [facility] like he did in the past and follow up with md with phone call  [id number] - [address] [place] - [address] Phone: [phone]  Fax: [phone]

## 2022-09-01 ENCOUNTER — Other Ambulatory Visit: Payer: Self-pay | Admitting: Family Medicine

## 2022-09-01 DIAGNOSIS — M109 Gout, unspecified: Secondary | ICD-10-CM

## 2022-09-01 MED ORDER — CHLORTHALIDONE 25 MG PO TABS: 25.0000 mg | ORAL_TABLET | Freq: Every day | ORAL | 1 refills | Status: AC

## 2022-09-01 MED ORDER — BENAZEPRIL HCL 40 MG PO TABS: 40.0000 mg | ORAL_TABLET | Freq: Every day | ORAL | 1 refills | Status: AC

## 2022-09-01 MED ORDER — ALLOPURINOL 300 MG PO TABS: 300.0000 mg | ORAL_TABLET | Freq: Every day | ORAL | 1 refills | Status: DC

## 2022-09-01 NOTE — Telephone Encounter (Signed)
Rx sent 

## 2022-09-15 ENCOUNTER — Other Ambulatory Visit: Payer: Self-pay | Admitting: Family Medicine

## 2022-09-30 ENCOUNTER — Other Ambulatory Visit: Payer: Self-pay | Admitting: Family Medicine

## 2022-10-01 ENCOUNTER — Other Ambulatory Visit: Payer: Self-pay | Admitting: Family

## 2022-10-01 DIAGNOSIS — M109 Gout, unspecified: Secondary | ICD-10-CM

## 2022-10-07 ENCOUNTER — Other Ambulatory Visit: Payer: Self-pay | Admitting: Family Medicine

## 2022-12-01 ENCOUNTER — Encounter (INDEPENDENT_AMBULATORY_CARE_PROVIDER_SITE_OTHER): Payer: Self-pay

## 2022-12-27 ENCOUNTER — Telehealth: Payer: Self-pay

## 2022-12-27 NOTE — Telephone Encounter (Signed)
Unsuccessful attempt to reach patient on preferred number listed in notes for scheduled AWV. Left message on voicemail okay to reschedule. 

## 2024-04-10 MED ORDER — ALLOPURINOL 300 MG PO TABS: 300.0000 mg | ORAL_TABLET | Freq: Every day | ORAL | 0 refills | Status: AC

## 2024-04-10 NOTE — Addendum Note (Signed)
 Addended by: METTA KRISTEN CROME on: 04/10/2024 03:25 PM   Modules accepted: Orders
